# Patient Record
Sex: Female | Born: 1980 | Race: White | Hispanic: No | Marital: Married | Smoking: Never smoker
Health system: Southern US, Community
[De-identification: ages and names within clinical notes are randomized; demographics above are authoritative.]

## PROBLEM LIST (undated history)

## (undated) DIAGNOSIS — Z5189 Encounter for other specified aftercare: Secondary | ICD-10-CM

## (undated) DIAGNOSIS — K219 Gastro-esophageal reflux disease without esophagitis: Secondary | ICD-10-CM

## (undated) DIAGNOSIS — O24419 Gestational diabetes mellitus in pregnancy, unspecified control: Secondary | ICD-10-CM

## (undated) HISTORY — PX: WISDOM TOOTH EXTRACTION: SHX21

## (undated) HISTORY — DX: Gastro-esophageal reflux disease without esophagitis: K21.9

## (undated) HISTORY — DX: Encounter for other specified aftercare: Z51.89

---

## 2014-12-28 ENCOUNTER — Ambulatory Visit (INDEPENDENT_AMBULATORY_CARE_PROVIDER_SITE_OTHER): Payer: BLUE CROSS/BLUE SHIELD | Admitting: Urgent Care

## 2014-12-28 VITALS — BP 102/74 | HR 77 | Temp 98.4°F | Resp 16 | Ht 62.0 in | Wt 149.0 lb

## 2014-12-28 DIAGNOSIS — N63 Unspecified lump in breast: Secondary | ICD-10-CM

## 2014-12-28 DIAGNOSIS — N6310 Unspecified lump in the right breast, unspecified quadrant: Secondary | ICD-10-CM

## 2014-12-28 NOTE — Patient Instructions (Signed)
Fibroadenoma A fibroadenoma is a small, round, rubbery lump (tumor). The lump is not cancer. It often does not cause pain. It may move slightly when you touch it. This kind of lump can grow in one or both breasts. HOME CARE  Check your lump often for any changes.  Keep all follow-up exams and mammograms. GET HELP RIGHT AWAY IF:  The lump changes in size.  The lump becomes tender and painful.  You have fluid coming from your nipple. MAKE SURE YOU:  Understand these instructions.  Will watch your condition.  Will get help right away if you are not doing well or get worse. Document Released: 01/17/2009 Document Revised: 01/13/2012 Document Reviewed: 01/17/2009 [facility] Patient Information 2015 [place]. This information is not intended to replace advice given to you by your health care provider. Make sure you discuss any questions you have with your health care provider.    Breast Cyst A breast cyst is a sac in the breast that is filled with fluid. Breast cysts are common in women. Women can have one or many cysts. When the breasts contain many cysts, it is usually due to a noncancerous (benign) condition called fibrocystic change. These lumps form under the influence of female hormones (estrogen and progesterone). The lumps are most often located in the upper, outer portion of the breast. They are often more swollen, painful, and tender before your period starts. They usually disappear after menopause, unless you are on hormone therapy.  There are several types of cysts:  Macrocyst. This is a cyst that is about 2 in. (5.1 cm) in diameter.   Microcyst. This is a tiny cyst that you cannot feel but can be seen with a mammogram or an ultrasound.   Galactocele. This is a cyst containing milk that may develop if you suddenly stop breastfeeding.   Sebaceous cyst of the skin. This type of cyst is not in the breast tissue itself. Breast cysts do not increase your risk of breast  cancer. However, they must be monitored closely because they can be cancerous.  CAUSES  It is not known exactly what causes a breast cyst to form. Possible causes include:  An overgrowth of milk glands and connective tissue in the breast can block the milk glands, causing them to fill with fluid.   Scar tissue in the breast from previous surgery may block the glands, causing a cyst.  RISK FACTORS Estrogen may influence the development of a breast cyst.  SIGNS AND SYMPTOMS   Feeling a smooth, round, soft lump (like a grape) in the breast that is easily moveable.   Breast discomfort or pain.  Increase in size of the lump before your menstrual period and decrease in its size after your menstrual period.  DIAGNOSIS  A cyst can be felt during a physical exam by your health care provider. A breast X-ray exam (mammogram) and ultrasonography will be done to confirm the diagnosis. Fluid may be removed from the cyst with a needle (fine needle aspiration) to make sure the cyst is not cancerous.  TREATMENT  Treatment may not be necessary. Your health care provider may monitor the cyst to see if it goes away on its own. If treatment is needed, it may include:  Hormone treatment.   Needle aspiration. There is a chance of the cyst coming back after aspiration.   Surgery to remove the whole cyst.  HOME CARE INSTRUCTIONS   Keep all follow-up appointments with your health care provider.  See your  health care provider regularly:  Get a yearly exam by your health care provider.  Have a clinical breast exam by a health care provider every 1-3 years if you are 60-72 years of age. After age 71 years, you should have the exam every year.   Get mammogram tests as directed by your health care provider.   Understand the normal appearance and feel of your breasts and perform breast self-exams.   Only take over-the-counter or prescription medicines as directed by your health care provider.    Wear a supportive bra, especially when exercising.   Avoid caffeine.   Reduce your salt intake, especially before your menstrual period. Too much salt can cause fluid retention, breast swelling, and discomfort.  SEEK MEDICAL CARE IF:   You feel, or think you feel, a lump in your breast.   You notice that both breasts look or feel different than usual.   Your breast is still causing pain after your menstrual period is over.   You need medicine for breast pain and swelling that occurs with your menstrual period.  SEEK IMMEDIATE MEDICAL CARE IF:   You have severe pain, tenderness, redness, or warmth in your breast.   You have nipple discharge or bleeding.   Your breast lump becomes hard and painful.   You find new lumps or bumps that were not there before.   You feel lumps in your armpit (axilla).   You notice dimpling or wrinkling of the breast or nipple.   You have a fever.  MAKE SURE YOU:  Understand these instructions.  Will watch your condition.  Will get help right away if you are not doing well or get worse. Document Released: 10/21/2005 Document Revised: 06/23/2013 Document Reviewed: 05/20/2013 [facility] Patient Information 2015 [place], [place]. This information is not intended to replace advice given to you by your health care provider. Make sure you discuss any questions you have with your health care provider.

## 2014-12-28 NOTE — Progress Notes (Signed)
    MRN: [id number] DOB: 09/12/1981  Subjective:   Cindy Cooper is a 34 y.o. female presenting for chief complaint of Breast Mass  Reports 2 day history of breast mass found while scratching over the area. Mass is non-tender, firm, immobile. Denies skin changes, rashes, erythema, nipple discharge, nipple inversion, edema, fevers, weight loss, decreased appetite. Denies first-degree relative history of breast cancer. Has 3 children, first child born (2006). Was 34 y/o at menarche. Has never had any masses, has never had reason to check. Denies any other aggravating or relieving factors, no other questions or concerns.  Isaiah currently has no medications in their medication list.  She has No Known Allergies.  Cristian  has a past medical history of Blood transfusion without reported diagnosis and GERD (gastroesophageal reflux disease). Also  has past surgical history that includes Cesarean section.  ROS As in subjective.  Objective:   Vitals: BP 102/74 mmHg  Pulse 77  Temp(Src) 98.4 F (36.9 C) (Oral)  Resp 16  Ht 5\' 2"  (1.575 m)  Wt 149 lb (67.586 kg)  BMI 27.25 kg/m2  SpO2 99%  LMP 11/23/2014  Physical Exam  Constitutional: She is well-developed, well-nourished, and in no distress.  Cardiovascular: Normal rate.   Pulmonary/Chest: Effort normal. Right breast exhibits mass and tenderness. Right breast exhibits no inverted nipple, no nipple discharge and no skin change. Left breast exhibits no inverted nipple, no mass, no nipple discharge, no skin change and no tenderness. Breasts are symmetrical.    No axillary lymphadenopathy.  Skin: Skin is warm and dry. No rash noted. No erythema.   Assessment and Plan :   1. Breast mass, right - Low risk factors for malignancy, likely fibroadenoma versus cyst of breast - Sent for US, [facility] mamm[facility] - Return to clinic in 1 week or sooner if symptoms of infection develop as discussed with  patient  Wallis BambergMario Alynah Schone, PA-C [facility] [phone] 12/28/2014 1:56 PM

## 2015-01-02 ENCOUNTER — Other Ambulatory Visit: Payer: Self-pay | Admitting: Urgent Care

## 2015-01-02 DIAGNOSIS — N6310 Unspecified lump in the right breast, unspecified quadrant: Secondary | ICD-10-CM

## 2015-01-04 ENCOUNTER — Ambulatory Visit
Admission: RE | Admit: 2015-01-04 | Discharge: 2015-01-04 | Disposition: A | Discharge status: Home / Self Care | Payer: BLUE CROSS/BLUE SHIELD | Source: Ambulatory Visit | Attending: Urgent Care | Admitting: Urgent Care

## 2015-01-04 ENCOUNTER — Other Ambulatory Visit: Payer: Self-pay | Admitting: Urgent Care

## 2015-01-04 ENCOUNTER — Telehealth: Payer: Self-pay | Admitting: Urgent Care

## 2015-01-04 DIAGNOSIS — N6310 Unspecified lump in the right breast, unspecified quadrant: Secondary | ICD-10-CM

## 2015-01-04 NOTE — Telephone Encounter (Signed)
Spoke with patient about ultrasound results which was ordered to evaluate a potential breast mass in right breast. Ultrasound was completed and patient was reassured. Anticipatory guidance provided.  Wallis BambergMario Ivylynn Hoppes, PA-C [facility] [phone] 01/04/2015  6:16 PM

## 2015-04-12 ENCOUNTER — Ambulatory Visit (INDEPENDENT_AMBULATORY_CARE_PROVIDER_SITE_OTHER): Payer: BLUE CROSS/BLUE SHIELD | Admitting: Certified Nurse Midwife

## 2015-04-12 ENCOUNTER — Encounter: Payer: Self-pay | Admitting: Certified Nurse Midwife

## 2015-04-12 VITALS — BP 117/77 | HR 74 | Temp 98.7°F | Wt 151.0 lb

## 2015-04-12 DIAGNOSIS — Z3481 Encounter for supervision of other normal pregnancy, first trimester: Secondary | ICD-10-CM

## 2015-04-12 DIAGNOSIS — Z3687 Encounter for antenatal screening for uncertain dates: Secondary | ICD-10-CM

## 2015-04-12 DIAGNOSIS — Z3482 Encounter for supervision of other normal pregnancy, second trimester: Secondary | ICD-10-CM | POA: Diagnosis not present

## 2015-04-12 DIAGNOSIS — B079 Viral wart, unspecified: Secondary | ICD-10-CM

## 2015-04-12 DIAGNOSIS — Z36 Encounter for antenatal screening of mother: Secondary | ICD-10-CM

## 2015-04-12 DIAGNOSIS — O4690 Antepartum hemorrhage, unspecified, unspecified trimester: Secondary | ICD-10-CM

## 2015-04-12 LAB — POCT URINALYSIS DIPSTICK
BILIRUBIN UA: NEGATIVE
Blood, UA: NEGATIVE
Glucose, UA: NEGATIVE
Ketones, UA: NEGATIVE
LEUKOCYTES UA: NEGATIVE
Nitrite, UA: NEGATIVE
PH UA: 7
Protein, UA: NEGATIVE
Urobilinogen, UA: NEGATIVE

## 2015-04-12 MED ORDER — CITRANATAL ASSURE 35-1 & 300 MG PO MISC: 1.0000 | Freq: Every day | ORAL | Status: AC

## 2015-04-12 MED ORDER — DOXYLAMINE-PYRIDOXINE 10-10 MG PO TBEC: DELAYED_RELEASE_TABLET | ORAL | Status: DC

## 2015-04-12 NOTE — Progress Notes (Signed)
Subjective:    Cindy Cooper is being seen today for her first obstetrical visit.  This is not a planned pregnancy but was not preventing pregnancy. She is at 3728w2d gestation. Her obstetrical history is significant for GDM, hx of postpartum hemorrhage after c-section, has had 3 c-sections, had thrombocytopenia with last pregnancy. Relationship with FOB: spouse, living together. Patient does not intend to breast feed. Pregnancy history fully reviewed.  Has a hx of irregular cycles.  Reports vaginal bleeding with sexual intercourse and has had small amount of brown spotting irregularlly.    The information documented in the HPI was reviewed and verified.  Menstrual History: OB History    Gravida Para Term Preterm AB TAB SAB Ectopic Multiple Living   4 3 3       3       Menarche age: 9311-11 yrs of age.    Patient's last menstrual period was 02/13/2015 (approximate).  Unsure of LMP.      Past Medical History  Diagnosis Date  . Blood transfusion without reported diagnosis   . GERD (gastroesophageal reflux disease)     Past Surgical History  Procedure Laterality Date  . Cesarean section    . Wisdom tooth extraction       (Not in a hospital admission) No Known Allergies  History  Substance Use Topics  . Smoking status: Never Smoker   . Smokeless tobacco: Never Used  . Alcohol Use: No    Family History  Problem Relation Age of Onset  . Diabetes Mother   . Diabetes Father   . Hyperlipidemia Father   . Hypertension Father      Review of Systems Constitutional: negative for weight loss Gastrointestinal: negative for vomiting, + nausea Genitourinary:negative for genital lesions and vaginal discharge and dysuria Musculoskeletal:negative for back pain Behavioral/Psych: negative for abusive relationship, depression, illegal drug usage and tobacco use    Objective:    BP 117/77 mmHg  Pulse 74  Temp(Src) 98.7 F (37.1 C)  Wt 151 lb (68.493 kg)  LMP 02/13/2015 (Approximate) General  Appearance:    Alert, cooperative, no distress, appears stated age  Head:    Normocephalic, without obvious abnormality, atraumatic  Eyes:    PERRL, conjunctiva/corneas clear, EOM's intact, fundi    benign, both eyes  Ears:    Normal TM's and external ear canals, both ears  Nose:   Nares normal, septum midline, mucosa normal, no drainage    or sinus tenderness  Throat:   Lips, mucosa, and tongue normal; teeth and gums normal  Neck:   Supple, symmetrical, trachea midline, no adenopathy;    thyroid:  no enlargement/tenderness/nodules; no carotid   bruit or JVD  Back:     Symmetric, no curvature, ROM normal, no CVA tenderness  Lungs:     Clear to auscultation bilaterally, respirations unlabored  Chest Wall:    No tenderness or deformity   Heart:    Regular rate and rhythm, S1 and S2 normal, no murmur, rub   or gallop  Breast Exam:    No tenderness, masses, or nipple abnormality  Abdomen:     Soft, non-tender, bowel sounds active all four quadrants,    no masses, no organomegaly  Genitalia:    Normal female without lesion, discharge or tenderness  Extremities:   Extremities normal, atraumatic, no cyanosis or edema  Pulses:   2+ and symmetric all extremities  Skin:   Skin color, texture, turgor normal, no rashes. + wart on right knee, has been their for  about 4 months.   Lymph nodes:   Cervical, supraclavicular, and axillary nodes normal  Neurologic:   CNII-XII intact, normal strength, sensation and reflexes    throughout      Lab Review Urine pregnancy test Labs reviewed yes Radiologic studies reviewed no Assessment:    Pregnancy at [redacted]w[redacted]d weeks    Plan:      Prenatal vitamins.  Counseling provided regarding continued use of seat belts, cessation of alcohol consumption, smoking or use of illicit drugs; infection precautions i.e., influenza/TDAP immunizations, toxoplasmosis,CMV, parvovirus, listeria and varicella; workplace safety, exercise during pregnancy; routine dental care, safe  medications, sexual activity, hot tubs, saunas, pools, travel, caffeine use, fish and methlymercury, potential toxins, hair treatments, varicose veins Weight gain recommendations per IOM guidelines reviewed: underweight/BMI< 18.5--> gain 28 - 40 lbs; normal weight/BMI 18.5 - 24.9--> gain 25 - 35 lbs; overweight/BMI 25 - 29.9--> gain 15 - 25 lbs; obese/BMI >30->gain  11 - 20 lbs Problem list reviewed and updated. FIRST/CF mutation testing/NIPT/QUAD SCREEN/fragile X/Ashkenazi Jewish population testing/Spinal muscular atrophy discussed: declined. Role of ultrasound in pregnancy discussed; fetal survey: requested. Amniocentesis discussed: not indicated.  Meds ordered this encounter  Medications  . Prenatal Vit-Fe Fumarate-FA (PRENATAL VITAMINS PLUS PO)    Sig: Take by mouth.  . Prenat w/o A-FeCbGl-DSS-FA-DHA (CITRANATAL ASSURE) 35-1 & 300 MG tablet    Sig: Take 1 tablet by mouth daily.    Dispense:  60 tablet    Refill:  12  . Doxylamine-Pyridoxine (DICLEGIS) 10-10 MG TBEC    Sig: Take 1 tab at breakfast, 1 tablet at lunch, and 2 tablets at bedtime.    Dispense:  100 tablet    Refill:  4   Orders Placed This Encounter  Procedures  . Culture, OB Urine  . SureSwab, Vaginosis/Vaginitis Plus  . US OB Comp Less 14 Wks    Standing Status: Future     Number of Occurrences:      Standing Expiration Date: 06/11/2016    Order Specific Question:  Reason for Exam (SYMPTOM  OR DIAGNOSIS REQUIRED)    Answer:  unsure of LMP and has had vaginal bleeding after intercourse    Order Specific Question:  Preferred imaging location?    Answer:  Internal  . Obstetric panel  . HIV antibody  . Hemoglobinopathy evaluation  . Varicella zoster antibody, IgG  . Vit D  25 hydroxy (rtn osteoporosis monitoring)  . Ambulatory referral to Dermatology    Referral Priority:  Routine    Referral Type:  Consultation    Referral Reason:  Specialty Services Required    Requested Specialty:  Dermatology    Number of  Visits Requested:  1  . POCT urinalysis dipstick    Follow up in 4 weeks. 50% of 30 min visit spent on counseling and coordination of care.

## 2015-04-13 LAB — OBSTETRIC PANEL
Antibody Screen: NEGATIVE
BASOS ABS: 0 10*3/uL (ref 0.0–0.1)
Basophils Relative: 0 % (ref 0–1)
EOS PCT: 4 % (ref 0–5)
Eosinophils Absolute: 0.4 10*3/uL (ref 0.0–0.7)
HCT: 40.3 % (ref 36.0–46.0)
HEMOGLOBIN: 13.6 g/dL (ref 12.0–15.0)
Hepatitis B Surface Ag: NEGATIVE
Lymphocytes Relative: 25 % (ref 12–46)
Lymphs Abs: 2.6 10*3/uL (ref 0.7–4.0)
MCH: 27.6 pg (ref 26.0–34.0)
MCHC: 33.7 g/dL (ref 30.0–36.0)
MCV: 81.7 fL (ref 78.0–100.0)
MONO ABS: 0.6 10*3/uL (ref 0.1–1.0)
MPV: 10.7 fL (ref 8.6–12.4)
Monocytes Relative: 6 % (ref 3–12)
Neutro Abs: 6.8 10*3/uL (ref 1.7–7.7)
Neutrophils Relative %: 65 % (ref 43–77)
Platelets: 156 10*3/uL (ref 150–400)
RBC: 4.93 MIL/uL (ref 3.87–5.11)
RDW: 13.8 % (ref 11.5–15.5)
Rh Type: NEGATIVE
Rubella: 1.01 Index — ABNORMAL HIGH (ref <0.90)
WBC: 10.4 10*3/uL (ref 4.0–10.5)

## 2015-04-13 LAB — VITAMIN D 25 HYDROXY (VIT D DEFICIENCY, FRACTURES): Vit D, 25-Hydroxy: 22 ng/mL — ABNORMAL LOW (ref 30–100)

## 2015-04-13 LAB — VARICELLA ZOSTER ANTIBODY, IGG: Varicella IgG: 277.1 Index — ABNORMAL HIGH (ref <135.00)

## 2015-04-13 LAB — CULTURE, OB URINE
Colony Count: NO GROWTH
Organism ID, Bacteria: NO GROWTH

## 2015-04-13 LAB — HIV ANTIBODY (ROUTINE TESTING W REFLEX): HIV: NONREACTIVE

## 2015-04-14 LAB — HEMOGLOBINOPATHY EVALUATION
HEMOGLOBIN OTHER: 0 %
HGB F QUANT: 0 % (ref 0.0–2.0)
HGB S QUANTITAION: 0 %
Hgb A2 Quant: 2.6 % (ref 2.2–3.2)
Hgb A: 97.4 % (ref 96.8–97.8)

## 2015-04-14 LAB — PAP IG AND HPV HIGH-RISK: HPV DNA HIGH RISK: NOT DETECTED

## 2015-04-15 LAB — SURESWAB, VAGINOSIS/VAGINITIS PLUS
Atopobium vaginae: NOT DETECTED Log (cells/mL)
C. GLABRATA, DNA: NOT DETECTED
C. PARAPSILOSIS, DNA: NOT DETECTED
C. albicans, DNA: NOT DETECTED
C. trachomatis RNA, TMA: NOT DETECTED
C. tropicalis, DNA: NOT DETECTED
LACTOBACILLUS SPECIES: 6.5 Log (cells/mL)
MEGASPHAERA SPECIES: NOT DETECTED Log (cells/mL)
N. GONORRHOEAE RNA, TMA: NOT DETECTED
T. VAGINALIS RNA, QL TMA: NOT DETECTED

## 2015-04-17 ENCOUNTER — Telehealth: Payer: Self-pay

## 2015-04-17 NOTE — Telephone Encounter (Signed)
PATIENT IS SCHEDULED WITH DR Joanna Puff FOR DERMATOLOGY APPT ON 05/16/15 AT 9:15AM

## 2015-04-28 ENCOUNTER — Ambulatory Visit (INDEPENDENT_AMBULATORY_CARE_PROVIDER_SITE_OTHER): Payer: BLUE CROSS/BLUE SHIELD

## 2015-04-28 DIAGNOSIS — Z3687 Encounter for antenatal screening for uncertain dates: Secondary | ICD-10-CM

## 2015-04-28 DIAGNOSIS — Z36 Encounter for antenatal screening of mother: Secondary | ICD-10-CM | POA: Diagnosis not present

## 2015-04-28 DIAGNOSIS — O4690 Antepartum hemorrhage, unspecified, unspecified trimester: Secondary | ICD-10-CM | POA: Diagnosis not present

## 2015-04-28 LAB — US OB COMP LESS 14 WKS

## 2015-05-10 ENCOUNTER — Encounter: Payer: BLUE CROSS/BLUE SHIELD | Admitting: Certified Nurse Midwife

## 2015-05-10 ENCOUNTER — Other Ambulatory Visit: Payer: Self-pay | Admitting: Certified Nurse Midwife

## 2015-05-12 ENCOUNTER — Encounter: Payer: Self-pay | Admitting: Certified Nurse Midwife

## 2015-05-12 ENCOUNTER — Ambulatory Visit (INDEPENDENT_AMBULATORY_CARE_PROVIDER_SITE_OTHER): Payer: BLUE CROSS/BLUE SHIELD | Admitting: Certified Nurse Midwife

## 2015-05-12 VITALS — BP 120/74 | HR 73 | Temp 100.1°F | Wt 152.4 lb

## 2015-05-12 DIAGNOSIS — Z3492 Encounter for supervision of normal pregnancy, unspecified, second trimester: Secondary | ICD-10-CM

## 2015-05-12 NOTE — Progress Notes (Signed)
  Subjective:    Cindy Cooper is a 34 y.o. female being seen today for her obstetrical visit. She is at 4045w3d gestation. Patient reports: nausea, no bleeding, no contractions, no cramping, no leaking and vomiting and round ligament pain of sharp stabbing pain sensation with sudden movement.  RX for abdominal support belt given.   Problem List Items Addressed This Visit    None    Visit Diagnoses    Prenatal care, second trimester    -  Primary    Relevant Orders    POCT urinalysis dipstick      There are no active problems to display for this patient.   Objective:     BP 120/74 mmHg  Pulse 73  Temp(Src) 100.1 F (37.8 C)  Wt 152 lb 6.4 oz (69.128 kg)  LMP 02/13/2015 (Approximate) Uterine Size: Below umbilicus   FHR activity seen on bedside US.   Assessment:    Pregnancy @ 8445w3d  weeks Doing well    Round ligament pain  Plan:    Problem list reviewed and updated. Labs reviewed. Rx: abdominal maternity support belt Follow up in 4 weeks. FIRST/CF mutation testing/NIPT/QUAD SCREEN/fragile X/Ashkenazi Jewish population testing/Spinal muscular atrophy discussed: requested. Role of ultrasound in pregnancy discussed; fetal survey: requested. Amniocentesis discussed: not indicated. 50% of 15 minute visit spent on counseling and coordination of care.

## 2015-06-09 ENCOUNTER — Ambulatory Visit (INDEPENDENT_AMBULATORY_CARE_PROVIDER_SITE_OTHER): Payer: BLUE CROSS/BLUE SHIELD | Admitting: Certified Nurse Midwife

## 2015-06-09 VITALS — BP 119/72 | HR 100 | Temp 98.9°F | Wt 154.8 lb

## 2015-06-09 DIAGNOSIS — Z3482 Encounter for supervision of other normal pregnancy, second trimester: Secondary | ICD-10-CM | POA: Diagnosis not present

## 2015-06-09 LAB — POCT URINALYSIS DIPSTICK
BILIRUBIN UA: NEGATIVE
Blood, UA: NEGATIVE
Glucose, UA: NORMAL
Ketones, UA: NEGATIVE
Leukocytes, UA: NEGATIVE
NITRITE UA: NEGATIVE
Protein, UA: NEGATIVE
SPEC GRAV UA: 1.02
Urobilinogen, UA: NEGATIVE
pH, UA: 5

## 2015-06-09 NOTE — Progress Notes (Signed)
  Subjective:    Cindy Cooper is a 34 y.o. female being seen today for her obstetrical visit. She is at [redacted]w[redacted]d gestation. Patient reports: no complaints.  Problem List Items Addressed This Visit    None    Visit Diagnoses    Supervision of other normal pregnancy, antepartum, second trimester    -  Primary    Relevant Orders    AFP, Quad Screen    US OB Comp + 14 Wk    POCT urinalysis dipstick (Completed)      There are no active problems to display for this patient.   Objective:     BP 119/72 mmHg  Pulse 100  Temp(Src) 98.9 F (37.2 C)  Wt 154 lb 12.8 oz (70.217 kg)  LMP 02/13/2015 (Approximate) Uterine Size: Below umbilicus   FHR: 150's by doppler  Assessment:    Pregnancy @ [redacted]w[redacted]d  weeks Doing well    Plan:    Problem list reviewed and updated. Labs reviewed. Follow up in 4 weeks. FIRST/CF mutation testing/NIPT/QUAD SCREEN/fragile X/Ashkenazi Jewish population testing/Spinal muscular atrophy discussed: ordered. Role of ultrasound in pregnancy discussed; fetal survey: ordered. Amniocentesis discussed: not indicated. 50% of 15 minute visit spent on counseling and coordination of care.

## 2015-06-14 LAB — AFP, QUAD SCREEN
AFP: 29.3 ng/mL
CURR GEST AGE: 15.3 wks.days
HCG TOTAL: 38.82 [IU]/mL
INH: 222.1 pg/mL
Interpretation-AFP: NEGATIVE
MOM FOR INH: 1.27
MoM for AFP: 0.96
MoM for hCG: 0.87
Open Spina bifida: NEGATIVE
Osb Risk: 1:15400 {titer}
Tri 18 Scr Risk Est: NEGATIVE
Trisomy 18 (Edward) Syndrome Interp.: 1:21900 {titer}
uE3 Mom: 1.18
uE3 Value: 0.79 ng/mL

## 2015-07-07 ENCOUNTER — Other Ambulatory Visit: Payer: Self-pay | Admitting: Certified Nurse Midwife

## 2015-07-07 ENCOUNTER — Ambulatory Visit (HOSPITAL_COMMUNITY)
Admission: RE | Admit: 2015-07-07 | Discharge: 2015-07-07 | Disposition: A | Discharge status: Home / Self Care | Payer: BLUE CROSS/BLUE SHIELD | Source: Ambulatory Visit | Attending: Certified Nurse Midwife | Admitting: Certified Nurse Midwife

## 2015-07-07 ENCOUNTER — Ambulatory Visit (INDEPENDENT_AMBULATORY_CARE_PROVIDER_SITE_OTHER): Payer: BLUE CROSS/BLUE SHIELD | Admitting: Certified Nurse Midwife

## 2015-07-07 VITALS — BP 121/75 | HR 89 | Temp 98.5°F | Wt 161.2 lb

## 2015-07-07 DIAGNOSIS — Z3482 Encounter for supervision of other normal pregnancy, second trimester: Secondary | ICD-10-CM | POA: Diagnosis not present

## 2015-07-07 DIAGNOSIS — O09522 Supervision of elderly multigravida, second trimester: Secondary | ICD-10-CM | POA: Insufficient documentation

## 2015-07-07 DIAGNOSIS — Z3481 Encounter for supervision of other normal pregnancy, first trimester: Secondary | ICD-10-CM

## 2015-07-07 DIAGNOSIS — O34219 Maternal care for unspecified type scar from previous cesarean delivery: Secondary | ICD-10-CM

## 2015-07-07 DIAGNOSIS — K219 Gastro-esophageal reflux disease without esophagitis: Secondary | ICD-10-CM

## 2015-07-07 DIAGNOSIS — O3421 Maternal care for scar from previous cesarean delivery: Secondary | ICD-10-CM | POA: Diagnosis present

## 2015-07-07 DIAGNOSIS — Z3A19 19 weeks gestation of pregnancy: Secondary | ICD-10-CM

## 2015-07-07 LAB — POCT URINALYSIS DIPSTICK
Bilirubin, UA: NEGATIVE
Glucose, UA: NORMAL
Ketones, UA: NEGATIVE
Leukocytes, UA: NEGATIVE
Nitrite, UA: NEGATIVE
PH UA: 5
PROTEIN UA: NEGATIVE
RBC UA: NEGATIVE
SPEC GRAV UA: 1.02
UROBILINOGEN UA: NEGATIVE

## 2015-07-07 IMAGING — US US MFM OB DETAIL+14 WK
1 series · 14 of 28 positions shown · non-contrast
Comparison: none

[Series 1: us mfm ob detail+14 wk · 70 acquisitions, 14 frames shown]
[im 3/70]
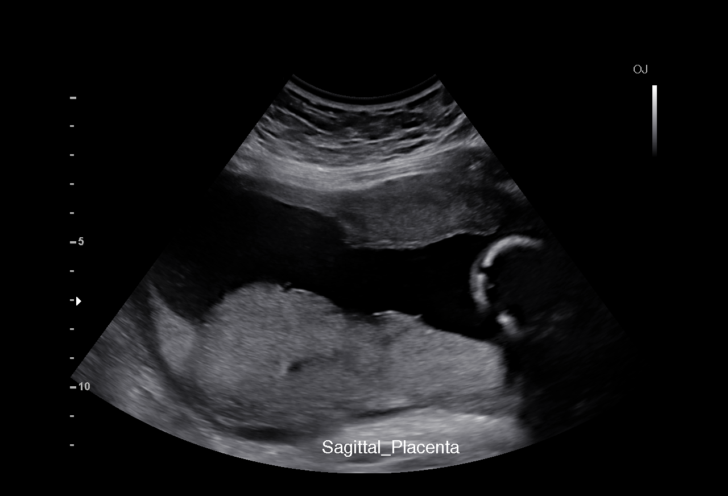
[im 8/70]
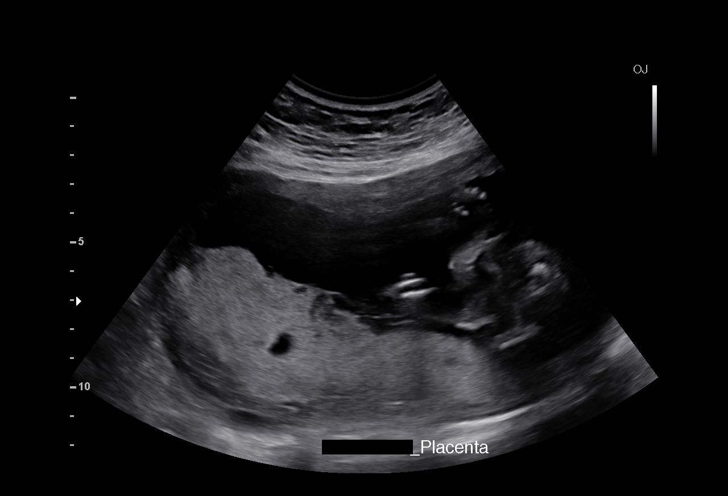
[im 13/70]
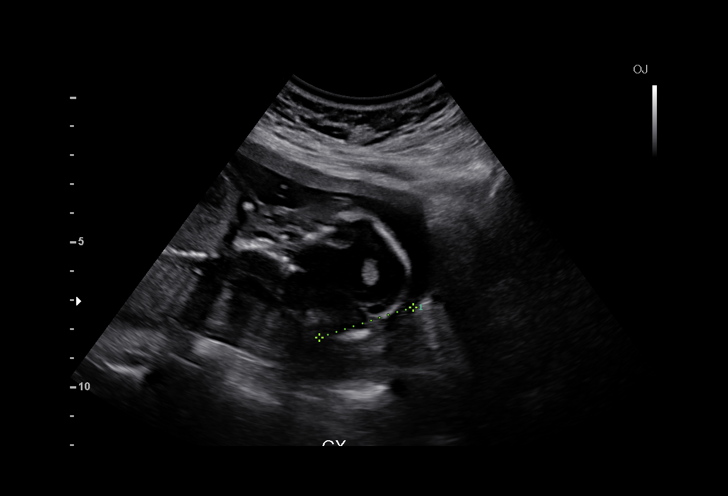
[im 18/70]
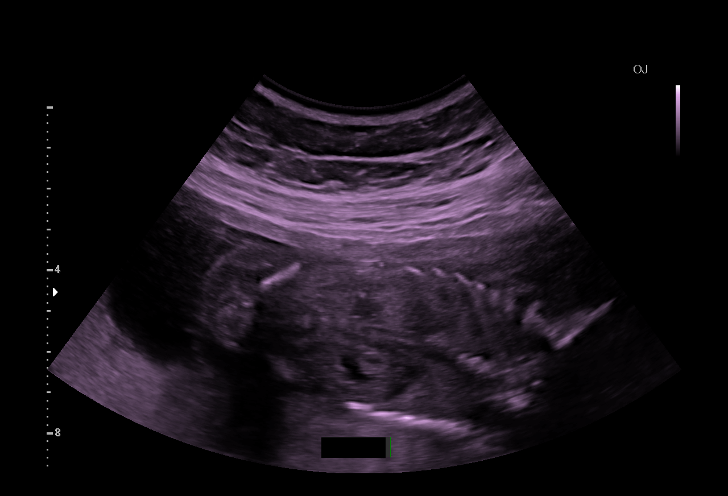
[im 24/70]
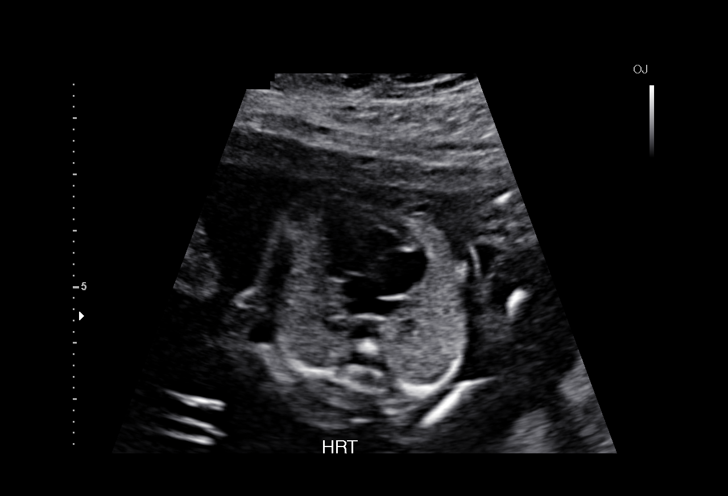
[im 29/70]
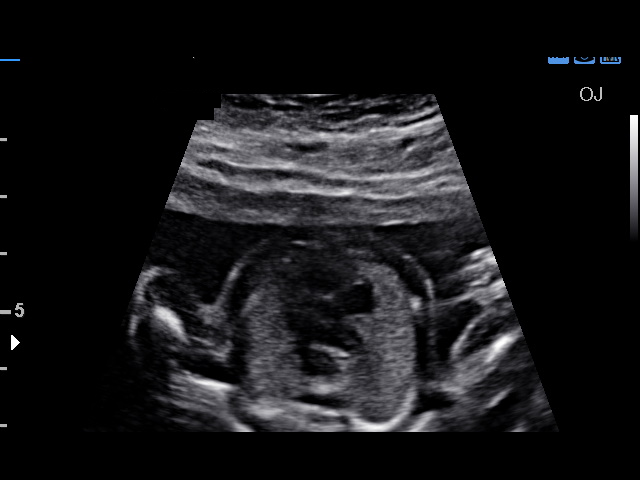
[im 34/70]
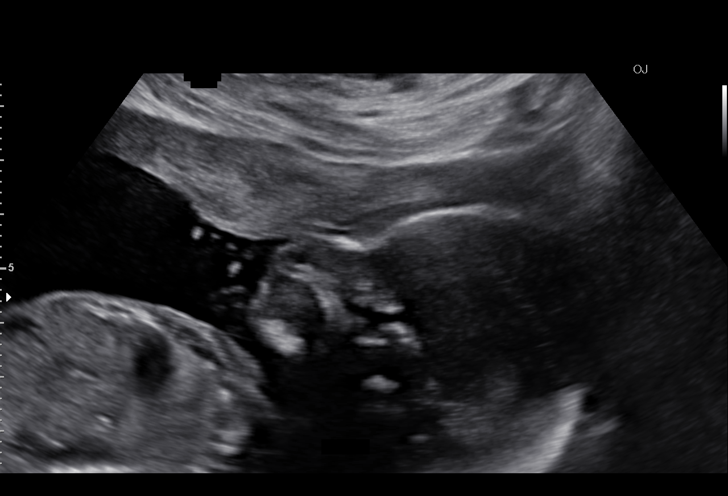
[im 39/70]
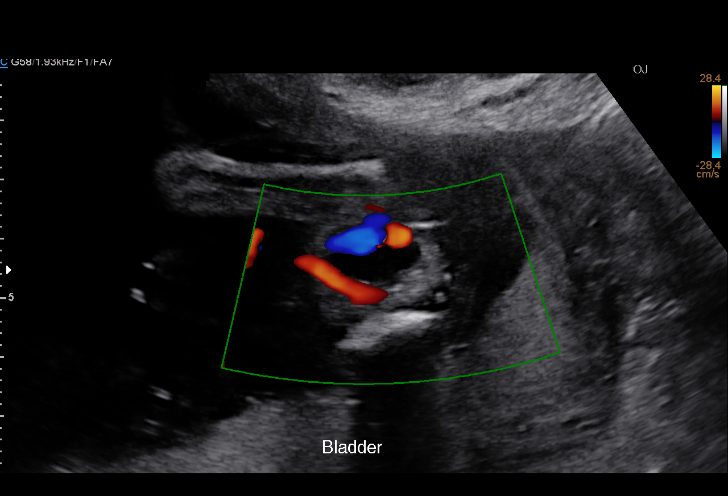
[im 44/70]
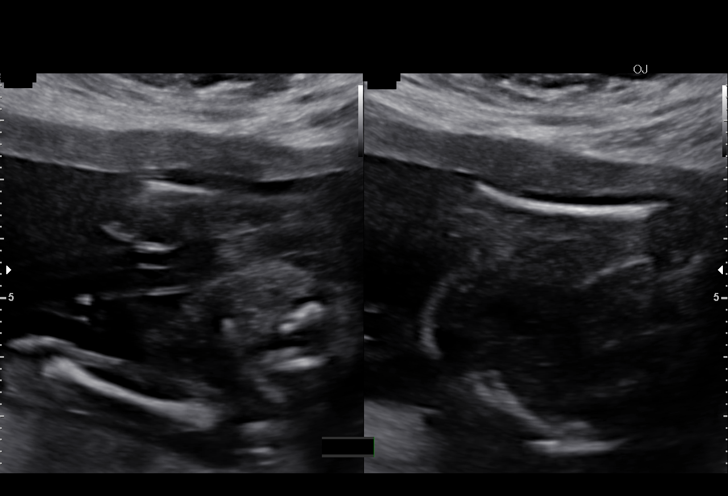
[im 49/70]
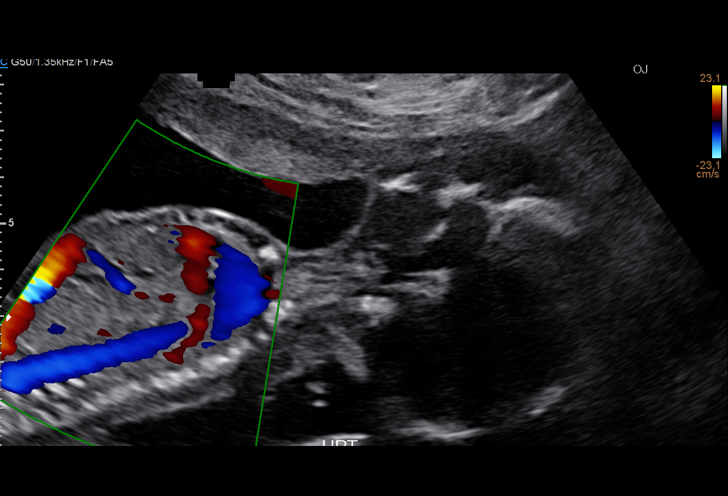
[im 54/70]
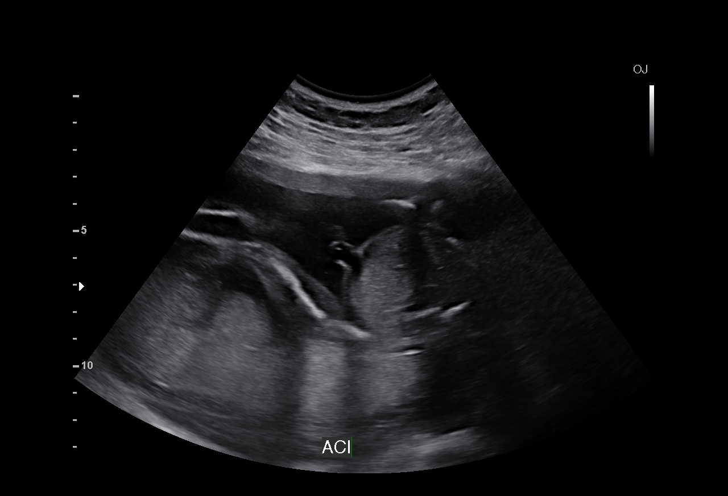
[im 59/70]
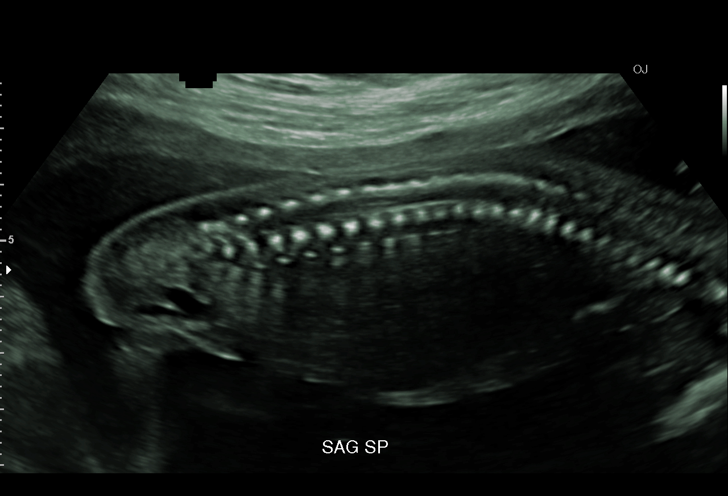
[im 64/70]
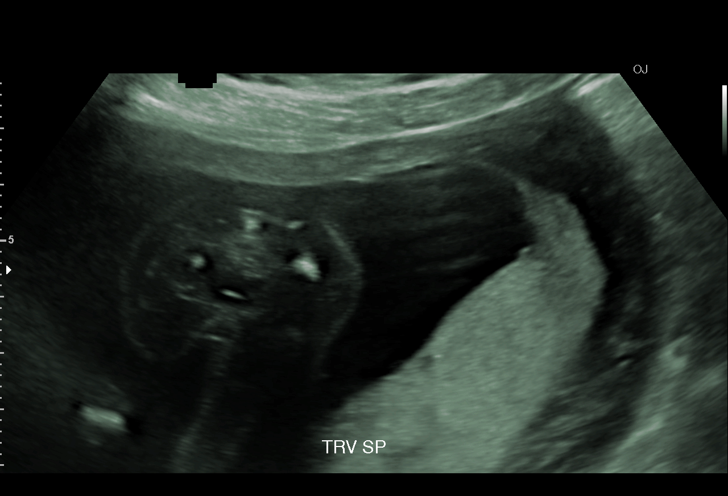
[im 70/70]
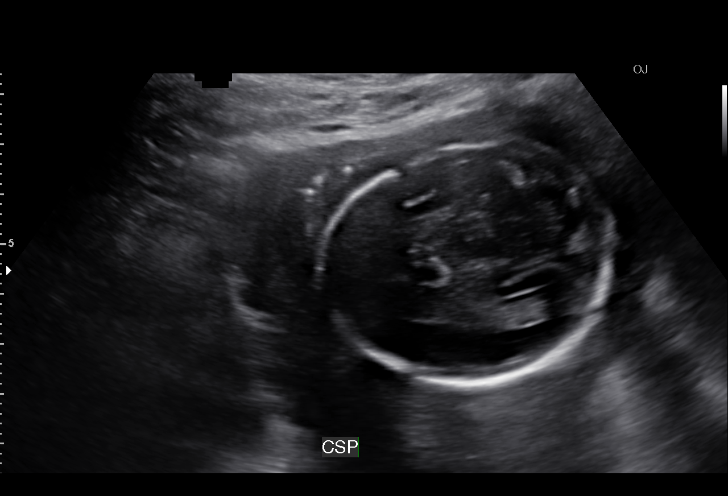

[14 of 28 positions shown; findings below may reference images not displayed]

OBSTETRICS REPORT

Service(s) Provided

Indications

History of cesarean delivery, currently pregnant      [J0]
Advanced maternal age multigravida 35+, second        [J0]
trimester
Detailed fetal anatomic survey                        Z36
19 weeks gestation of pregnancy
Fetal Evaluation

Num Of Fetuses:    1
Fetal Heart Rate:  160                          bpm
Cardiac Activity:  Observed
Presentation:      Cephalic
Placenta:          Posterior, above cervical
os
P. Cord            Previously Visualized
Insertion:

Amniotic Fluid
AFI FV:      Subjectively within normal limits
Larg Pckt:     5.1  cm
Biometry

BPD:     46.9  mm     G. Age:  20w 1d                CI:        75.55   70 - 86
FL/HC:      18.6   16.1 -
18.3
HC:     171.1  mm     G. Age:  19w 5d       56  %    HC/AC:      1.19   1.09 -
1.39
AC:     143.6  mm     G. Age:  19w 5d       54  %    FL/BPD:
FL:      31.8  mm     G. Age:  19w 6d       59  %    FL/AC:      22.1   20 - 24
HUM:     31.5  mm     G. Age:  20w 4d       80  %

Est. FW:     313  gm    0 lb 11 oz      52  %
Gestational Age

LMP:           20w 4d        Date:  [DATE]                 EDD:   [DATE]
U/S Today:     19w 6d                                        EDD:   [DATE]
Best:          19w 3d     Det. By:  Early Ultrasound         EDD:   [DATE]
([DATE])
Anatomy

Cranium:          Appears normal         Aortic Arch:      Appears normal
Fetal Cavum:      Appears normal         Ductal Arch:      Appears normal
Ventricles:       Appears normal         Diaphragm:        Appears normal
Choroid Plexus:   Appears normal         Stomach:          Appears normal, left
sided
Cerebellum:       Appears normal         Abdomen:          Appears normal
Posterior Fossa:  Appears normal         Abdominal Wall:   Appears nml (cord
insert, abd wall)
Nuchal Fold:      Appears normal         Cord Vessels:     Appears normal (3
vessel cord)
Face:             Appears normal         Kidneys:          Appear normal
(orbits and profile)
Lips:             Appears normal         Bladder:          Appears normal
Heart:            Appears normal         Spine:            Appears normal
(4CH, axis, and
situs)
RVOT:             Appears normal         Lower             Appears normal
Extremities:
LVOT:             Appears normal         Upper             Appears normal
Extremities:

Other:  Female gender.
Cervix Uterus Adnexa

Cervical Length:    3.6      cm

Cervix:       Normal appearance by transabdominal scan.

Left Ovary:    Not visualized. No adnexal mass visualized.
Right Ovary:   Not visualized. No adnexal mass visualized.
Impression

Singleton intrauterine pregnancy at 19 weeks 3 days
gestation with fetal cardiac activity
Cephalic presentation
Posterior placenta without evidence of previa
Normal appearing fetal growth and amniotic fluid volume
No apparent birth defects noted on fetal anatomic survey
Normal appearing cervical length
Recommendations

Follow-up ultrasounds as clinically indicated.

questions or concerns.

## 2015-07-07 MED ORDER — LANSOPRAZOLE 30 MG PO CPDR: 30.0000 mg | DELAYED_RELEASE_CAPSULE | Freq: Every day | ORAL | Status: AC

## 2015-07-07 NOTE — Progress Notes (Signed)
Subjective:    Cindy Cooper is a 34 y.o. female being seen today for her obstetrical visit. She is at [redacted]w[redacted]d gestation. Patient reports: heartburn, no bleeding, no contractions, no cramping and no leaking . States that Tums are not working, is eating small frequent bland meals.  Fetal movement: normal.  Problem List Items Addressed This Visit    None    Visit Diagnoses    Gastroesophageal reflux disease, esophagitis presence not specified    -  Primary    Relevant Medications    lansoprazole (PREVACID) 30 MG capsule      There are no active problems to display for this patient.  Objective:    BP 121/75 mmHg  Pulse 89  Temp(Src) 98.5 F (36.9 C)  Wt 161 lb 3.2 oz (73.12 kg)  LMP 02/13/2015 (Approximate) FHT: 145 BPM  Uterine Size: size equals dates     Assessment:    Pregnancy @ [redacted]w[redacted]d    GERD Doing well Plan:    OBGCT: discussed. Signs and symptoms of preterm labor: discussed.  Labs, problem list reviewed and updated 2 hr GTT planned Follow up in 4 weeks.

## 2015-07-07 NOTE — Addendum Note (Signed)
Addended by: Clearnce Hasten on: 07/07/2015 04:30 PM   Modules accepted: Orders

## 2015-07-13 ENCOUNTER — Other Ambulatory Visit: Payer: Self-pay | Admitting: Certified Nurse Midwife

## 2015-08-04 ENCOUNTER — Encounter: Payer: BLUE CROSS/BLUE SHIELD | Admitting: Certified Nurse Midwife

## 2015-08-11 ENCOUNTER — Ambulatory Visit (INDEPENDENT_AMBULATORY_CARE_PROVIDER_SITE_OTHER): Payer: BLUE CROSS/BLUE SHIELD | Admitting: Certified Nurse Midwife

## 2015-08-11 VITALS — BP 117/72 | HR 86 | Temp 98.1°F | Wt 167.0 lb

## 2015-08-11 DIAGNOSIS — Z3482 Encounter for supervision of other normal pregnancy, second trimester: Secondary | ICD-10-CM

## 2015-08-11 DIAGNOSIS — O09523 Supervision of elderly multigravida, third trimester: Secondary | ICD-10-CM | POA: Diagnosis not present

## 2015-08-11 DIAGNOSIS — G44201 Tension-type headache, unspecified, intractable: Secondary | ICD-10-CM

## 2015-08-11 LAB — POCT URINALYSIS DIPSTICK
BILIRUBIN UA: NEGATIVE
Blood, UA: NEGATIVE
GLUCOSE UA: NEGATIVE
KETONES UA: NEGATIVE
LEUKOCYTES UA: NEGATIVE
NITRITE UA: NEGATIVE
Protein, UA: NEGATIVE
Spec Grav, UA: 1.02
Urobilinogen, UA: NEGATIVE
pH, UA: 5

## 2015-08-11 MED ORDER — BUTALBITAL-APAP-CAFFEINE 50-325-40 MG PO TABS: 1.0000 | ORAL_TABLET | Freq: Four times a day (QID) | ORAL | Status: AC | PRN

## 2015-08-11 NOTE — Progress Notes (Signed)
Subjective:    Cindy Cooper is a 34 y.o. female being seen today for her obstetrical visit. She is at [redacted]w[redacted]d gestation. Patient reports: no bleeding, no contractions, no leaking and cramping since yesturday.  States that she has been drinking water.  Denies any pelvic pressure . Fetal movement: normal.  Problem List Items Addressed This Visit    None    Visit Diagnoses    Supervision of other normal pregnancy, antepartum, second trimester    -  Primary    Relevant Orders    POCT urinalysis dipstick (Completed)    Intractable tension-type headache, unspecified chronicity pattern        Relevant Medications    butalbital-acetaminophen-caffeine (FIORICET) 50-325-40 MG tablet      There are no active problems to display for this patient.  Objective:    BP 117/72 mmHg  Pulse 86  Temp(Src) 98.1 F (36.7 C)  Wt 167 lb (75.751 kg)  LMP 02/13/2015 (Approximate) FHT: 135 BPM  Uterine Size: size equals dates   Toco:  No contractions for 20 minutes, some irritability noted with fetal movement.   Assessment:    Pregnancy @ [redacted]w[redacted]d    Uterine irritability most likely d/t fetal movement  RH- status, Rhogam at next ROB  Plan:    OBGCT: discussed. Signs and symptoms of preterm labor: discussed.  Labs, problem list reviewed and updated 2 hr GTT planned for next ROB visit.  Follow up in 4 weeks.

## 2015-09-08 ENCOUNTER — Other Ambulatory Visit: Payer: BLUE CROSS/BLUE SHIELD

## 2015-09-08 ENCOUNTER — Ambulatory Visit (INDEPENDENT_AMBULATORY_CARE_PROVIDER_SITE_OTHER): Payer: BLUE CROSS/BLUE SHIELD | Admitting: Certified Nurse Midwife

## 2015-09-08 VITALS — BP 111/72 | HR 93 | Temp 98.0°F | Wt 170.0 lb

## 2015-09-08 DIAGNOSIS — O360130 Maternal care for anti-D [Rh] antibodies, third trimester, not applicable or unspecified: Secondary | ICD-10-CM

## 2015-09-08 DIAGNOSIS — Z3493 Encounter for supervision of normal pregnancy, unspecified, third trimester: Secondary | ICD-10-CM

## 2015-09-08 LAB — POCT URINALYSIS DIPSTICK
Bilirubin, UA: NEGATIVE
Blood, UA: NEGATIVE
Glucose, UA: NEGATIVE
KETONES UA: NEGATIVE
LEUKOCYTES UA: NEGATIVE
Nitrite, UA: NEGATIVE
PH UA: 8.5
Spec Grav, UA: <=1.005
Urobilinogen, UA: NEGATIVE

## 2015-09-08 LAB — CBC
HCT: 33.7 % — ABNORMAL LOW (ref 36.0–46.0)
Hemoglobin: 11.4 g/dL — ABNORMAL LOW (ref 12.0–15.0)
MCH: 29.2 pg (ref 26.0–34.0)
MCHC: 33.8 g/dL (ref 30.0–36.0)
MCV: 86.2 fL (ref 78.0–100.0)
MPV: 9.9 fL (ref 8.6–12.4)
PLATELETS: 122 10*3/uL — AB (ref 150–400)
RBC: 3.91 MIL/uL (ref 3.87–5.11)
RDW: 13.6 % (ref 11.5–15.5)
WBC: 9.3 10*3/uL (ref 4.0–10.5)

## 2015-09-08 MED ORDER — RHO D IMMUNE GLOBULIN 1500 UNIT/2ML IJ SOSY
300.0000 ug | PREFILLED_SYRINGE | Freq: Once | INTRAMUSCULAR | Status: AC
  Administered 2015-09-08: 300 ug via INTRAMUSCULAR

## 2015-09-08 NOTE — Progress Notes (Signed)
Subjective:    Cindy Cooper is a 34 y.o. female being seen today for her obstetrical visit. She is at 9250w3d gestation. Patient reports no complaints. Fetal movement: normal.  Problem List Items Addressed This Visit    None    Visit Diagnoses    Prenatal care, third trimester    -  Primary    Relevant Orders    POCT urinalysis dipstick (Completed)    Glucose Tolerance, 2 Hours w/1 Hour    CBC    HIV antibody    RPR    Rh negative state in antepartum period, third trimester, not applicable or unspecified fetus        Relevant Medications    rho (d) immune globulin (RHIG/RHOPHYLAC) injection 300 mcg (Completed)      There are no active problems to display for this patient.  Objective:    BP 111/72 mmHg  Pulse 93  Temp(Src) 98 F (36.7 C)  Wt 170 lb (77.111 kg)  LMP 02/13/2015 (Approximate) FHT:  145 BPM  Uterine Size: size equals dates  Presentation: cephalic     Assessment:    Pregnancy @ 250w3d weeks   Doing well  Plan:     labs reviewed, problem list updated Consent signed. GBS planning TDAP offered  Rhogam given for RH negative Pediatrician: discussed. Infant feeding: plans to breastfeed. Maternity leave: discussed. Cigarette smoking: never smoked. Orders Placed This Encounter  Procedures  . Glucose Tolerance, 2 Hours w/1 Hour  . CBC  . HIV antibody  . RPR  . POCT urinalysis dipstick   Meds ordered this encounter  Medications  . rho (d) immune globulin (RHIG/RHOPHYLAC) injection 300 mcg    Sig:    Follow up in 2 Weeks.

## 2015-09-08 NOTE — Progress Notes (Signed)
Patient reports she is doing well 

## 2015-09-09 LAB — GLUCOSE TOLERANCE, 2 HOURS W/ 1HR
GLUCOSE, 2 HOUR: 159 mg/dL — AB (ref 70–139)
GLUCOSE, FASTING: 88 mg/dL (ref 65–99)
GLUCOSE: 201 mg/dL — AB (ref 70–170)

## 2015-09-09 LAB — RPR

## 2015-09-09 LAB — HIV ANTIBODY (ROUTINE TESTING W REFLEX): HIV: NONREACTIVE

## 2015-09-12 ENCOUNTER — Other Ambulatory Visit: Payer: Self-pay | Admitting: Certified Nurse Midwife

## 2015-09-12 DIAGNOSIS — O24419 Gestational diabetes mellitus in pregnancy, unspecified control: Secondary | ICD-10-CM

## 2015-09-14 ENCOUNTER — Other Ambulatory Visit: Payer: Self-pay | Admitting: *Deleted

## 2015-09-18 ENCOUNTER — Ambulatory Visit (HOSPITAL_COMMUNITY)
Admission: RE | Admit: 2015-09-18 | Discharge: 2015-09-18 | Disposition: A | Discharge status: Home / Self Care | Payer: BLUE CROSS/BLUE SHIELD | Source: Ambulatory Visit | Attending: Certified Nurse Midwife | Admitting: Certified Nurse Midwife

## 2015-09-18 DIAGNOSIS — O34219 Maternal care for unspecified type scar from previous cesarean delivery: Secondary | ICD-10-CM | POA: Insufficient documentation

## 2015-09-18 DIAGNOSIS — Z36 Encounter for antenatal screening of mother: Secondary | ICD-10-CM | POA: Diagnosis not present

## 2015-09-18 DIAGNOSIS — Z3A29 29 weeks gestation of pregnancy: Secondary | ICD-10-CM | POA: Insufficient documentation

## 2015-09-18 DIAGNOSIS — O24419 Gestational diabetes mellitus in pregnancy, unspecified control: Secondary | ICD-10-CM | POA: Insufficient documentation

## 2015-09-18 DIAGNOSIS — O09522 Supervision of elderly multigravida, second trimester: Secondary | ICD-10-CM | POA: Diagnosis not present

## 2015-09-20 ENCOUNTER — Ambulatory Visit (INDEPENDENT_AMBULATORY_CARE_PROVIDER_SITE_OTHER): Payer: BLUE CROSS/BLUE SHIELD | Admitting: Obstetrics

## 2015-09-20 ENCOUNTER — Other Ambulatory Visit: Payer: Self-pay | Admitting: Certified Nurse Midwife

## 2015-09-20 VITALS — BP 133/81 | HR 103 | Wt 171.0 lb

## 2015-09-20 DIAGNOSIS — Z3483 Encounter for supervision of other normal pregnancy, third trimester: Secondary | ICD-10-CM

## 2015-09-20 LAB — POCT URINALYSIS DIPSTICK
Bilirubin, UA: NEGATIVE
Glucose, UA: NEGATIVE
Ketones, UA: NEGATIVE
Leukocytes, UA: NEGATIVE
NITRITE UA: NEGATIVE
PH UA: 7
Protein, UA: NEGATIVE
RBC UA: NEGATIVE
Spec Grav, UA: 1.01
UROBILINOGEN UA: NEGATIVE

## 2015-09-20 NOTE — Progress Notes (Signed)
Pt is having increase pelvic pain.  Pt has frequent HA.

## 2015-09-21 ENCOUNTER — Encounter: Payer: Self-pay | Admitting: Obstetrics

## 2015-09-21 ENCOUNTER — Telehealth: Payer: Self-pay | Admitting: *Deleted

## 2015-09-21 ENCOUNTER — Ambulatory Visit (HOSPITAL_COMMUNITY)
Admission: RE | Admit: 2015-09-21 | Discharge: 2015-09-21 | Disposition: A | Discharge status: Home / Self Care | Payer: BLUE CROSS/BLUE SHIELD | Source: Ambulatory Visit | Attending: Obstetrics | Admitting: Obstetrics

## 2015-09-21 ENCOUNTER — Encounter: Payer: BLUE CROSS/BLUE SHIELD | Attending: Certified Nurse Midwife | Admitting: *Deleted

## 2015-09-21 DIAGNOSIS — O24410 Gestational diabetes mellitus in pregnancy, diet controlled: Secondary | ICD-10-CM | POA: Diagnosis not present

## 2015-09-21 DIAGNOSIS — Z713 Dietary counseling and surveillance: Secondary | ICD-10-CM | POA: Insufficient documentation

## 2015-09-21 NOTE — Telephone Encounter (Signed)
Rejection notice received regarding pt blood glucose monitoring system.  Call placed to pharmacy.  Pt has BC/BS and Medicaid.  Pt meter and supplies were changed to [employer] in order to have coverage.  Pt should have no copay. Meter and all supplies were ordered for pt via phone.

## 2015-09-21 NOTE — Progress Notes (Signed)
  Patient was seen on 09/21/15 for Gestational Diabetes self-management . Both of Rorie's parents have T2DM. We discussed to risk factors for her developing t2DM as well.   The following learning objectives were met by the patient :   States the definition of Gestational Diabetes  States why dietary management is important in controlling blood glucose  Describes the effects of carbohydrates on blood glucose levels  Demonstrates ability to create a balanced meal plan  Demonstrates carbohydrate counting   States when to check blood glucose levels  Demonstrates proper blood glucose monitoring techniques  States the effect of stress and exercise on blood glucose levels  States the importance of limiting caffeine and abstaining from alcohol and smoking  Plan:  Aim for 2 Carb Choices per meal (30 grams) +/- 1 either way for breakfast Aim for 3 Carb Choices per meal (45 grams) +/- 1 either way from lunch and dinner Aim for 1-2 Carbs per snack Begin reading food labels for Total Carbohydrate and sugar grams of foods Consider  increasing your activity level by walking daily as tolerated Begin checking BG before breakfast and 2 hours after first bit of breakfast, lunch and dinner after  as directed by MD  Take medication  as directed by MD  Blood glucose monitor given: RX One Touch Verio Flex, strips and lancets called to [facility] [address] [phone] Blood glucose reading: 1.5hpp 128  Spaghetti and bread  Patient instructed to monitor glucose levels: FBS: 60 - <90 2 hour: <120  Patient received the following handouts:  Nutrition Diabetes and Pregnancy  Carbohydrate Counting List  Meal Planning worksheet  I will call patient on Monday to review glucose readings. Further f/u as needed

## 2015-09-21 NOTE — Progress Notes (Signed)
Subjective:    Cindy Cooper is a 34 y.o. female being seen today for her obstetrical visit. She is at 5018w2d gestation. Patient reports no complaints. Fetal movement: normal.  Problem List Items Addressed This Visit    None    Visit Diagnoses    Encounter for supervision of other normal pregnancy in third trimester    -  Primary    Relevant Orders    POCT urinalysis dipstick (Completed)      There are no active problems to display for this patient.  Objective:    BP 133/81 mmHg  Pulse 103  Wt 171 lb (77.565 kg)  LMP 02/13/2015 (Approximate) FHT:  150 BPM  Uterine Size: size equals dates  Presentation: unsure     Assessment:    Pregnancy @ 7918w2d weeks   Plan:     labs reviewed, problem list updated Consent signed. GBS sent TDAP offered  Rhogam given for RH negative Pediatrician: discussed. Infant feeding: plans to breastfeed. Maternity leave: discussed. Cigarette smoking: never smoked. Orders Placed This Encounter  Procedures  . POCT urinalysis dipstick   No orders of the defined types were placed in this encounter.   Follow up in 2 Weeks.

## 2015-09-27 ENCOUNTER — Ambulatory Visit (INDEPENDENT_AMBULATORY_CARE_PROVIDER_SITE_OTHER): Payer: BLUE CROSS/BLUE SHIELD | Admitting: Certified Nurse Midwife

## 2015-09-27 ENCOUNTER — Inpatient Hospital Stay (HOSPITAL_COMMUNITY)
Admission: AD | Admit: 2015-09-27 | Discharge: 2015-10-01 | DRG: 766 | Disposition: A | Discharge status: Home / Self Care | Payer: BLUE CROSS/BLUE SHIELD | Source: Ambulatory Visit | Attending: Obstetrics | Admitting: Obstetrics

## 2015-09-27 ENCOUNTER — Encounter (HOSPITAL_COMMUNITY): Payer: Self-pay

## 2015-09-27 VITALS — BP 132/80 | HR 101 | Wt 171.0 lb

## 2015-09-27 DIAGNOSIS — O4703 False labor before 37 completed weeks of gestation, third trimester: Secondary | ICD-10-CM

## 2015-09-27 DIAGNOSIS — Z3A31 31 weeks gestation of pregnancy: Secondary | ICD-10-CM | POA: Diagnosis not present

## 2015-09-27 DIAGNOSIS — O322XX0 Maternal care for transverse and oblique lie, not applicable or unspecified: Secondary | ICD-10-CM | POA: Diagnosis present

## 2015-09-27 DIAGNOSIS — O99613 Diseases of the digestive system complicating pregnancy, third trimester: Secondary | ICD-10-CM | POA: Diagnosis present

## 2015-09-27 DIAGNOSIS — O4700 False labor before 37 completed weeks of gestation, unspecified trimester: Secondary | ICD-10-CM | POA: Diagnosis present

## 2015-09-27 DIAGNOSIS — O24410 Gestational diabetes mellitus in pregnancy, diet controlled: Secondary | ICD-10-CM | POA: Diagnosis present

## 2015-09-27 DIAGNOSIS — O34219 Maternal care for unspecified type scar from previous cesarean delivery: Secondary | ICD-10-CM | POA: Diagnosis present

## 2015-09-27 DIAGNOSIS — K219 Gastro-esophageal reflux disease without esophagitis: Secondary | ICD-10-CM | POA: Diagnosis present

## 2015-09-27 DIAGNOSIS — O479 False labor, unspecified: Secondary | ICD-10-CM | POA: Diagnosis present

## 2015-09-27 DIAGNOSIS — Z98891 History of uterine scar from previous surgery: Secondary | ICD-10-CM

## 2015-09-27 DIAGNOSIS — O09523 Supervision of elderly multigravida, third trimester: Secondary | ICD-10-CM | POA: Diagnosis not present

## 2015-09-27 DIAGNOSIS — Z3483 Encounter for supervision of other normal pregnancy, third trimester: Secondary | ICD-10-CM

## 2015-09-27 HISTORY — DX: Gestational diabetes mellitus in pregnancy, unspecified control: O24.419

## 2015-09-27 LAB — URINALYSIS, ROUTINE W REFLEX MICROSCOPIC
Bilirubin Urine: NEGATIVE
GLUCOSE, UA: NEGATIVE mg/dL
HGB URINE DIPSTICK: NEGATIVE
KETONES UR: 40 mg/dL — AB
LEUKOCYTES UA: NEGATIVE
Nitrite: NEGATIVE
PROTEIN: NEGATIVE mg/dL
Specific Gravity, Urine: >1.03 — ABNORMAL HIGH (ref 1.005–1.030)
pH: 6 (ref 5.0–8.0)

## 2015-09-27 LAB — LACTATE DEHYDROGENASE: LDH: 135 U/L (ref 98–192)

## 2015-09-27 LAB — WET PREP, GENITAL
Clue Cells Wet Prep HPF POC: NONE SEEN
Sperm: NONE SEEN
TRICH WET PREP: NONE SEEN
YEAST WET PREP: NONE SEEN

## 2015-09-27 LAB — POCT URINALYSIS DIPSTICK
Bilirubin, UA: NEGATIVE
Glucose, UA: NEGATIVE
KETONES UA: NEGATIVE
LEUKOCYTES UA: NEGATIVE
Nitrite, UA: NEGATIVE
PH UA: 5.5
SPEC GRAV UA: 1.015
UROBILINOGEN UA: 1

## 2015-09-27 LAB — COMPREHENSIVE METABOLIC PANEL
ALT: 10 U/L — ABNORMAL LOW (ref 14–54)
ANION GAP: 10 (ref 5–15)
AST: 16 U/L (ref 15–41)
Albumin: 2.7 g/dL — ABNORMAL LOW (ref 3.5–5.0)
Alkaline Phosphatase: 75 U/L (ref 38–126)
BILIRUBIN TOTAL: 0.5 mg/dL (ref 0.3–1.2)
BUN: 9 mg/dL (ref 6–20)
CO2: 19 mmol/L — ABNORMAL LOW (ref 22–32)
Calcium: 8.5 mg/dL — ABNORMAL LOW (ref 8.9–10.3)
Chloride: 104 mmol/L (ref 101–111)
Creatinine, Ser: 0.48 mg/dL (ref 0.44–1.00)
GFR calc Af Amer: >60 mL/min (ref >60)
Glucose, Bld: 115 mg/dL — ABNORMAL HIGH (ref 65–99)
POTASSIUM: 3.4 mmol/L — AB (ref 3.5–5.1)
Sodium: 133 mmol/L — ABNORMAL LOW (ref 135–145)
TOTAL PROTEIN: 6 g/dL — AB (ref 6.5–8.1)

## 2015-09-27 LAB — PROTEIN / CREATININE RATIO, URINE
Creatinine, Urine: 175 mg/dL
Protein Creatinine Ratio: 0.08 mg/mg{Cre} (ref 0.00–0.15)
Total Protein, Urine: 14 mg/dL

## 2015-09-27 LAB — CBC
HEMATOCRIT: 32.6 % — AB (ref 36.0–46.0)
Hemoglobin: 11 g/dL — ABNORMAL LOW (ref 12.0–15.0)
MCH: 29.3 pg (ref 26.0–34.0)
MCHC: 33.7 g/dL (ref 30.0–36.0)
MCV: 86.7 fL (ref 78.0–100.0)
Platelets: 129 10*3/uL — ABNORMAL LOW (ref 150–400)
RBC: 3.76 MIL/uL — ABNORMAL LOW (ref 3.87–5.11)
RDW: 14.2 % (ref 11.5–15.5)
WBC: 10.8 10*3/uL — ABNORMAL HIGH (ref 4.0–10.5)

## 2015-09-27 LAB — GLUCOSE, CAPILLARY: GLUCOSE-CAPILLARY: 135 mg/dL — AB (ref 65–99)

## 2015-09-27 LAB — URIC ACID: Uric Acid, Serum: 4.4 mg/dL (ref 2.3–6.6)

## 2015-09-27 MED ORDER — ACETAMINOPHEN 325 MG PO TABS: 650.0000 mg | ORAL_TABLET | ORAL | Status: DC | PRN

## 2015-09-27 MED ORDER — TERBUTALINE SULFATE 1 MG/ML IJ SOLN
INTRAMUSCULAR | Status: AC
  Filled 2015-09-27: qty 1

## 2015-09-27 MED ORDER — MAGNESIUM SULFATE 50 % IJ SOLN
2.0000 g/h | INTRAVENOUS | Status: DC
  Filled 2015-09-27: qty 80

## 2015-09-27 MED ORDER — TERBUTALINE SULFATE 1 MG/ML IJ SOLN
0.2500 mg | INTRAMUSCULAR | Status: AC | PRN
  Administered 2015-09-27 (×2): 0.25 mg via SUBCUTANEOUS
  Filled 2015-09-27: qty 1

## 2015-09-27 MED ORDER — LACTATED RINGERS IV SOLN
INTRAVENOUS | Status: DC
  Administered 2015-09-27 – 2015-09-28 (×2): via INTRAVENOUS

## 2015-09-27 MED ORDER — LACTATED RINGERS IV BOLUS (SEPSIS)
1000.0000 mL | Freq: Once | INTRAVENOUS | Status: AC
  Administered 2015-09-27: 1000 mL via INTRAVENOUS

## 2015-09-27 MED ORDER — BETAMETHASONE SOD PHOS & ACET 6 (3-3) MG/ML IJ SUSP
12.0000 mg | INTRAMUSCULAR | Status: DC
  Administered 2015-09-27: 12 mg via INTRAMUSCULAR
  Filled 2015-09-27 (×2): qty 2

## 2015-09-27 MED ORDER — MAGNESIUM SULFATE 50 % IJ SOLN
3.0000 g/h | INTRAVENOUS | Status: DC
  Filled 2015-09-27: qty 80

## 2015-09-27 MED ORDER — PRENATAL MULTIVITAMIN CH
1.0000 | ORAL_TABLET | Freq: Every day | ORAL | Status: DC
  Filled 2015-09-27: qty 1

## 2015-09-27 MED ORDER — ZOLPIDEM TARTRATE 5 MG PO TABS: 5.0000 mg | ORAL_TABLET | Freq: Every evening | ORAL | Status: DC | PRN

## 2015-09-27 MED ORDER — LACTATED RINGERS IV BOLUS (SEPSIS)
500.0000 mL | Freq: Once | INTRAVENOUS | Status: AC
  Administered 2015-09-27: 500 mL via INTRAVENOUS

## 2015-09-27 MED ORDER — OXYCODONE-ACETAMINOPHEN 5-325 MG PO TABS
1.0000 | ORAL_TABLET | Freq: Once | ORAL | Status: AC
  Administered 2015-09-27: 1 via ORAL
  Filled 2015-09-27: qty 1

## 2015-09-27 MED ORDER — NIFEDIPINE 10 MG PO CAPS
10.0000 mg | ORAL_CAPSULE | ORAL | Status: AC
  Administered 2015-09-27 (×3): 10 mg via ORAL
  Filled 2015-09-27 (×3): qty 1

## 2015-09-27 MED ORDER — MAGNESIUM SULFATE BOLUS VIA INFUSION
4.0000 g | Freq: Once | INTRAVENOUS | Status: AC
  Administered 2015-09-27: 4 g via INTRAVENOUS
  Filled 2015-09-27: qty 500

## 2015-09-27 NOTE — Progress Notes (Signed)
Subjective:    Cindy Cooper is a 34 y.o. female being seen today for her obstetrical visit. She is at 7711w1d gestation. Patient reports no bleeding, no contractions, no leaking and pelvic pressure, groin swelling.  Occasional HA that tylenol helps, reports just not feeling good.  Drinking around 2 bottles of water/day, encouraged increased fluid intake.  Reports CBG fasting around upper 90's and postparanial of less than 120's. Fetal movement: normal.  Problem List Items Addressed This Visit    None    Visit Diagnoses    Encounter for supervision of other normal pregnancy in third trimester    -  Primary    Relevant Orders    POCT urinalysis dipstick (Completed)    Culture, OB Urine      There are no active problems to display for this patient.  Objective:    BP 132/80 mmHg  Pulse 101  Wt 171 lb (77.565 kg)  LMP 02/13/2015 (Approximate) FHT:  145 BPM  Uterine Size: size equals dates  Presentation: cephalic   Cervix: long, thick, closed and posterior  Assessment:    Pregnancy @ 7211w1d weeks   Slight Dehydration  Elevated blood pressures with proteinuria  HA  Hematuria  GDM: diet controlled  Repeat C-section with BTL schedule for Mid January  Plan:    MAU for evaluation of elevated blood pressures with proteinuria and ?dehydration   labs reviewed, problem list updated Consent signed. GBS planning TDAP offered  Rhogam given for RH negative Pediatrician: discussed. Infant feeding: plans to breastfeed. Maternity leave: discussed. Cigarette smoking: never smoked. Orders Placed This Encounter  Procedures  . Culture, OB Urine  . POCT urinalysis dipstick   No orders of the defined types were placed in this encounter.   Follow up in 2 Weeks.

## 2015-09-27 NOTE — MAU Provider Note (Signed)
History     CSN: [id number]  Arrival date and time: 09/27/15 1526   First Provider Initiated Contact with Patient 09/27/15 1559         Chief Complaint  Patient presents with  . Pelvic Pain   HPI  Cindy Cooper is a 34 y.o. G4P3003 at [redacted]w[redacted]d who presents sent from office for [facility] evaluation & IV fluids.  States went to office today for vaginal & pelvic pain that has been constant for a few days.  Denies vaginal bleeding, LOF, vaginal discharge. Positive fetal movement.  Mild headache today. Denies vision changes, chest pain, SOB, or epigastric pain.  Patient states she was sent over for elevated BP in office, to make sure she wasn't SROM b/c the midwife "saw too much fluid on her glove when she checked me", and IV fluids.  Per flowsheet, BP in office today was 132/80 with trace protein on urine dip. Cervix closed in office. Patient is scheduled for repeat c/section in January.    OB History    Gravida Para Term Preterm AB TAB SAB Ectopic Multiple Living   Past Medical History  Diagnosis Date  . Blood transfusion without reported diagnosis   . GERD (gastroesophageal reflux disease)   . Gestational diabetes     Past Surgical History  Procedure Laterality Date  . Cesarean section    . Wisdom tooth extraction      Family History  Problem Relation Age of Onset  . Diabetes Mother   . Diabetes Father   . Hyperlipidemia Father   . Hypertension Father     Social History  Substance Use Topics  . Smoking status: Never Smoker   . Smokeless tobacco: Never Used  . Alcohol Use: No    Allergies: No Known Allergies  Prescriptions prior to admission  Medication Sig Dispense Refill Last Dose  . butalbital-acetaminophen-caffeine (FIORICET) 50-325-40 MG tablet Take 1-2 tablets by mouth every 6 (six) hours as needed for headache. 45 tablet 4 Taking  . lansoprazole (PREVACID) 30 MG capsule Take 1 capsule (30 mg total) by mouth daily. 30 capsule 5 Taking  . Prenat  w/o A-FeCbGl-DSS-FA-DHA (CITRANATAL ASSURE) 35-1 & 300 MG tablet Take 1 tablet by mouth daily. 60 tablet 12 Taking    Review of Systems  Constitutional: Negative.   Eyes: Negative for blurred vision.  Respiratory: Negative.   Cardiovascular: Negative.   Gastrointestinal: Positive for abdominal pain.  Genitourinary: Negative for dysuria.  Neurological: Positive for headaches.   Physical Exam   Blood pressure 124/75, pulse 86, temperature 98.6 F (37 C), temperature source Oral, resp. rate 18, height  (1.575 m), weight 171 lb 3.2 oz (77.656 kg), last menstrual period 02/13/2015.  Patient Vitals for the past 24 hrs:  BP Temp Temp src Pulse Resp Height Weight  09/27/15 1601 123/73 mmHg - - 85 - - -  09/27/15 1552 124/75 mmHg - - 86 - - -  09/27/15 1537 126/69 mmHg 98.6 F (37 C) Oral 91 18  (1.575 m) 171 lb 3.2 oz (77.656 kg)    Physical Exam  Nursing note and vitals reviewed. Constitutional: She is oriented to person, place, and time. She appears well-developed and well-nourished. No distress.  HENT:  Head: Normocephalic and atraumatic.  Eyes: Conjunctivae are normal. Right eye exhibits no discharge. Left eye exhibits no discharge. No scleral icterus.  Neck: Normal range of motion.  Cardiovascular: Normal rate, regular  rhythm and normal heart sounds.   No murmur heard. Respiratory: Effort normal and breath sounds normal. No respiratory distress. She has no wheezes.  GI: Soft. There is no tenderness. There is no guarding.  Genitourinary: Vagina normal. Cervix exhibits discharge.  Moderate amount of thick off white discharge No pooling  Neurological: She is alert and oriented to person, place, and time.  Skin: Skin is warm and dry. She is not diaphoretic.  Psychiatric: She has a normal mood and affect. Her behavior is normal. Judgment and thought content normal.   Dilation: Closed Effacement (%): Thick Cervical Position: Posterior Station: -3 Exam by:: Judeth HornErin  Jakiyah Stepney NP  Fetal Tracing:  Baseline: 140 Variability: moderate Accelerations: 15x15 Decelerations: none  Toco: 2-6, palpate mild   MAU Course  Procedures Results for orders placed or performed during the hospital encounter of 09/27/15 (from the past 24 hour(s))  Urinalysis, Routine w reflex microscopic ([facility])     Status: Abnormal   Collection Time: 09/27/15  3:35 PM  Result Value Ref Range   Color, Urine YELLOW YELLOW   APPearance HAZY (A) CLEAR   Specific Gravity, Urine >1.030 (H) 1.005 - 1.030   pH 6.0 5.0 - 8.0   Glucose, UA NEGATIVE NEGATIVE mg/dL   Hgb urine dipstick NEGATIVE NEGATIVE   Bilirubin Urine NEGATIVE NEGATIVE   Ketones, ur 40 (A) NEGATIVE mg/dL   Protein, ur NEGATIVE NEGATIVE mg/dL   Nitrite NEGATIVE NEGATIVE   Leukocytes, UA NEGATIVE NEGATIVE  Protein / creatinine ratio, urine     Status: None   Collection Time: 09/27/15  3:35 PM  Result Value Ref Range   Creatinine, Urine 175.00 mg/dL   Total Protein, Urine 14 mg/dL   Protein Creatinine Ratio 0.08 0.00 - 0.15 mg/mg[Cre]  CBC     Status: Abnormal   Collection Time: 09/27/15  4:20 PM  Result Value Ref Range   WBC 10.8 (H) 4.0 - 10.5 K/uL   RBC 3.76 (L) 3.87 - 5.11 MIL/uL   Hemoglobin 11.0 (L) 12.0 - 15.0 g/dL   HCT 19.132.6 (L) 47.836.0 - 29.546.0 %   MCV 86.7 78.0 - 100.0 fL   MCH 29.3 26.0 - 34.0 pg   MCHC 33.7 30.0 - 36.0 g/dL   RDW 62.114.2 30.811.5 - 65.715.5 %   Platelets 129 (L) 150 - 400 K/uL  Comprehensive metabolic panel     Status: Abnormal   Collection Time: 09/27/15  4:20 PM  Result Value Ref Range   Sodium 133 (L) 135 - 145 mmol/L   Potassium 3.4 (L) 3.5 - 5.1 mmol/L   Chloride 104 101 - 111 mmol/L   CO2 19 (L) 22 - 32 mmol/L   Glucose, Bld 115 (H) 65 - 99 mg/dL   BUN 9 6 - 20 mg/dL   Creatinine, Ser 8.460.48 0.44 - 1.00 mg/dL   Calcium 8.5 (L) 8.9 - 10.3 mg/dL   Total Protein 6.0 (L) 6.5 - 8.1 g/dL   Albumin 2.7 (L) 3.5 - 5.0 g/dL   AST 16 15 - 41 U/L   ALT 10 (L) 14 - 54 U/L   Alkaline  Phosphatase 75 38 - 126 U/L   Total Bilirubin 0.5 0.3 - 1.2 mg/dL   GFR calc non Af Amer >60 >60 mL/min   GFR calc Af Amer >60 >60 mL/min   Anion gap 10 5 - 15  Lactate dehydrogenase     Status: None   Collection Time: 09/27/15  4:20 PM  Result Value Ref Range  LDH 135 98 - 192 U/L  Uric acid     Status: None   Collection Time: 09/27/15  4:20 PM  Result Value Ref Range   Uric Acid, Serum 4.4 2.3 - 6.6 mg/dL  Wet prep, genital     Status: Abnormal   Collection Time: 09/27/15  4:44 PM  Result Value Ref Range   Yeast Wet Prep HPF POC NONE SEEN NONE SEEN   Trich, Wet Prep NONE SEEN NONE SEEN   Clue Cells Wet Prep HPF POC NONE SEEN NONE SEEN   WBC, Wet Prep HPF POC FEW (A) NONE SEEN   Sperm NONE SEEN     MDM Cervix closed.  No elevated BPs in MAU - PIH labs normal IV fluid bolus for contractions & dehydration No pooling & fern negative - wet prep sent for vaginal discharge Reactive fetal tracing Procardia PO given x 3 doses 1834- C/w Dr. Gaynell Face - give 2 doses of terbutaline Terbutaline given x 2, contractions continue. Cervix remains closed. Percocet given for constant low back pain since being in MAU.  2003- C/w Dr. Gaynell Face. Plan to admit patient to antenatal for magnesium & steroids.  Assessment and Plan  A: 1. Preterm contractions, third trimester   2. Diet controlled gestational diabetes mellitus in third trimester    P: Admit to antenatal  Magnesium sulfate 4/2 BMZ Fasting CBG  Judeth Horn, NP  09/27/2015, 3:59 PM

## 2015-09-27 NOTE — MAU Note (Signed)
Pt sent from the office for pelvic paiin and +1 protein in her urine. B/P slightly elevated for the pt norm. C/o Headache.

## 2015-09-28 ENCOUNTER — Encounter (HOSPITAL_COMMUNITY)
Admission: AD | Disposition: A | Discharge status: Home / Self Care | Payer: Self-pay | Source: Ambulatory Visit | Attending: Obstetrics

## 2015-09-28 ENCOUNTER — Encounter (HOSPITAL_COMMUNITY): Payer: Self-pay | Admitting: Anesthesiology

## 2015-09-28 ENCOUNTER — Inpatient Hospital Stay (HOSPITAL_COMMUNITY): Payer: BLUE CROSS/BLUE SHIELD | Admitting: Anesthesiology

## 2015-09-28 DIAGNOSIS — Z98891 History of uterine scar from previous surgery: Secondary | ICD-10-CM

## 2015-09-28 LAB — CBC
HCT: 28.5 % — ABNORMAL LOW (ref 36.0–46.0)
HEMOGLOBIN: 9.8 g/dL — AB (ref 12.0–15.0)
MCH: 29.7 pg (ref 26.0–34.0)
MCHC: 34.4 g/dL (ref 30.0–36.0)
MCV: 86.4 fL (ref 78.0–100.0)
Platelets: 127 10*3/uL — ABNORMAL LOW (ref 150–400)
RBC: 3.3 MIL/uL — AB (ref 3.87–5.11)
RDW: 14.2 % (ref 11.5–15.5)
WBC: 16.7 10*3/uL — ABNORMAL HIGH (ref 4.0–10.5)

## 2015-09-28 LAB — GLUCOSE, CAPILLARY
GLUCOSE-CAPILLARY: 174 mg/dL — AB (ref 65–99)
Glucose-Capillary: 162 mg/dL — ABNORMAL HIGH (ref 65–99)
Glucose-Capillary: 157 mg/dL — ABNORMAL HIGH (ref 65–99)

## 2015-09-28 SURGERY — Surgical Case
Anesthesia: Spinal

## 2015-09-28 MED ORDER — METHYLERGONOVINE MALEATE 0.2 MG/ML IJ SOLN
INTRAMUSCULAR | Status: DC | PRN
  Administered 2015-09-28: 0.2 mg via INTRAMUSCULAR

## 2015-09-28 MED ORDER — PRENATAL MULTIVITAMIN CH
1.0000 | ORAL_TABLET | Freq: Every day | ORAL | Status: DC
Start: 2015-09-28 — End: 2015-10-01
  Administered 2015-09-28 – 2015-09-30 (×3): 1 via ORAL
  Filled 2015-09-28 (×3): qty 1

## 2015-09-28 MED ORDER — SIMETHICONE 80 MG PO CHEW
80.0000 mg | CHEWABLE_TABLET | ORAL | Status: DC | PRN
  Administered 2015-09-29: 80 mg via ORAL

## 2015-09-28 MED ORDER — SCOPOLAMINE 1 MG/3DAYS TD PT72
MEDICATED_PATCH | TRANSDERMAL | Status: DC | PRN
  Administered 2015-09-28: 1 via TRANSDERMAL

## 2015-09-28 MED ORDER — FENTANYL CITRATE (PF) 100 MCG/2ML IJ SOLN
INTRAMUSCULAR | Status: DC | PRN
  Administered 2015-09-28: 80 ug via INTRAVENOUS
  Administered 2015-09-28: 20 ug via INTRATHECAL

## 2015-09-28 MED ORDER — IBUPROFEN 600 MG PO TABS
600.0000 mg | ORAL_TABLET | Freq: Four times a day (QID) | ORAL | Status: DC
Start: 2015-09-28 — End: 2015-10-01
  Administered 2015-09-28 – 2015-10-01 (×13): 600 mg via ORAL
  Filled 2015-09-28 (×13): qty 1

## 2015-09-28 MED ORDER — OXYCODONE-ACETAMINOPHEN 5-325 MG PO TABS
2.0000 | ORAL_TABLET | ORAL | Status: DC | PRN
  Administered 2015-09-29 (×2): 2 via ORAL
  Filled 2015-09-28 (×2): qty 2

## 2015-09-28 MED ORDER — PHENYLEPHRINE HCL 10 MG/ML IJ SOLN
INTRAMUSCULAR | Status: DC | PRN
  Administered 2015-09-28: 120 ug via INTRAVENOUS
  Administered 2015-09-28: 40 ug via INTRAVENOUS

## 2015-09-28 MED ORDER — OXYCODONE-ACETAMINOPHEN 5-325 MG PO TABS
1.0000 | ORAL_TABLET | ORAL | Status: DC | PRN
  Administered 2015-09-29 – 2015-10-01 (×7): 1 via ORAL
  Filled 2015-09-28 (×7): qty 1

## 2015-09-28 MED ORDER — SENNOSIDES-DOCUSATE SODIUM 8.6-50 MG PO TABS
2.0000 | ORAL_TABLET | ORAL | Status: DC
  Administered 2015-09-28 – 2015-09-30 (×3): 2 via ORAL
  Filled 2015-09-28 (×3): qty 2

## 2015-09-28 MED ORDER — DIBUCAINE 1 % RE OINT: 1.0000 "application " | TOPICAL_OINTMENT | RECTAL | Status: DC | PRN

## 2015-09-28 MED ORDER — DIPHENHYDRAMINE HCL 50 MG/ML IJ SOLN: 12.5000 mg | INTRAMUSCULAR | Status: DC | PRN

## 2015-09-28 MED ORDER — LANOLIN HYDROUS EX OINT: 1.0000 "application " | TOPICAL_OINTMENT | CUTANEOUS | Status: DC | PRN

## 2015-09-28 MED ORDER — CEFAZOLIN SODIUM-DEXTROSE 2-3 GM-% IV SOLR
INTRAVENOUS | Status: AC
  Filled 2015-09-28: qty 50

## 2015-09-28 MED ORDER — MORPHINE SULFATE (PF) 0.5 MG/ML IJ SOLN
INTRAMUSCULAR | Status: AC
  Filled 2015-09-28: qty 10

## 2015-09-28 MED ORDER — EPHEDRINE SULFATE 50 MG/ML IJ SOLN
INTRAMUSCULAR | Status: DC | PRN
  Administered 2015-09-28: 10 mg via INTRAVENOUS

## 2015-09-28 MED ORDER — KETOROLAC TROMETHAMINE 30 MG/ML IJ SOLN: 30.0000 mg | Freq: Four times a day (QID) | INTRAMUSCULAR | Status: AC | PRN

## 2015-09-28 MED ORDER — SCOPOLAMINE 1 MG/3DAYS TD PT72
1.0000 | MEDICATED_PATCH | Freq: Once | TRANSDERMAL | Status: DC
  Filled 2015-09-28: qty 1

## 2015-09-28 MED ORDER — MENTHOL 3 MG MT LOZG: 1.0000 | LOZENGE | OROMUCOSAL | Status: DC | PRN

## 2015-09-28 MED ORDER — SIMETHICONE 80 MG PO CHEW
80.0000 mg | CHEWABLE_TABLET | Freq: Three times a day (TID) | ORAL | Status: DC
  Administered 2015-09-28 – 2015-10-01 (×11): 80 mg via ORAL
  Filled 2015-09-28 (×11): qty 1

## 2015-09-28 MED ORDER — OXYTOCIN 10 UNIT/ML IJ SOLN
INTRAMUSCULAR | Status: AC
  Filled 2015-09-28: qty 4

## 2015-09-28 MED ORDER — LACTATED RINGERS IV SOLN
40.0000 [IU] | INTRAVENOUS | Status: DC | PRN
  Administered 2015-09-28: 40 [IU] via INTRAVENOUS

## 2015-09-28 MED ORDER — NALBUPHINE HCL 10 MG/ML IJ SOLN: 5.0000 mg | Freq: Once | INTRAMUSCULAR | Status: DC | PRN

## 2015-09-28 MED ORDER — DIPHENHYDRAMINE HCL 25 MG PO CAPS: 25.0000 mg | ORAL_CAPSULE | Freq: Four times a day (QID) | ORAL | Status: DC | PRN

## 2015-09-28 MED ORDER — PHENYLEPHRINE 8 MG IN D5W 100 ML (0.08MG/ML) PREMIX OPTIME
INJECTION | INTRAVENOUS | Status: AC
  Filled 2015-09-28: qty 100

## 2015-09-28 MED ORDER — TETANUS-DIPHTH-ACELL PERTUSSIS 5-2.5-18.5 LF-MCG/0.5 IM SUSP
0.5000 mL | Freq: Once | INTRAMUSCULAR | Status: AC
  Administered 2015-09-29: 0.5 mL via INTRAMUSCULAR
  Filled 2015-09-28: qty 0.5

## 2015-09-28 MED ORDER — CITRIC ACID-SODIUM CITRATE 334-500 MG/5ML PO SOLN
ORAL | Status: AC
  Administered 2015-09-28: 30 mL
  Filled 2015-09-28: qty 15

## 2015-09-28 MED ORDER — LACTATED RINGERS IV SOLN
INTRAVENOUS | Status: DC
Start: 2015-09-28 — End: 2015-09-28

## 2015-09-28 MED ORDER — FENTANYL CITRATE (PF) 100 MCG/2ML IJ SOLN: 25.0000 ug | INTRAMUSCULAR | Status: DC | PRN

## 2015-09-28 MED ORDER — ONDANSETRON HCL 4 MG/2ML IJ SOLN
INTRAMUSCULAR | Status: DC | PRN
  Administered 2015-09-28: 4 mg via INTRAVENOUS

## 2015-09-28 MED ORDER — SODIUM CHLORIDE 0.9 % IJ SOLN: 3.0000 mL | INTRAMUSCULAR | Status: DC | PRN

## 2015-09-28 MED ORDER — ACETAMINOPHEN 325 MG PO TABS: 650.0000 mg | ORAL_TABLET | ORAL | Status: DC | PRN

## 2015-09-28 MED ORDER — OXYTOCIN 40 UNITS IN LACTATED RINGERS INFUSION - SIMPLE MED: 62.5000 mL/h | INTRAVENOUS | Status: AC

## 2015-09-28 MED ORDER — ONDANSETRON HCL 4 MG/2ML IJ SOLN: 4.0000 mg | Freq: Three times a day (TID) | INTRAMUSCULAR | Status: DC | PRN

## 2015-09-28 MED ORDER — KETOROLAC TROMETHAMINE 30 MG/ML IJ SOLN
INTRAMUSCULAR | Status: AC
  Filled 2015-09-28: qty 1

## 2015-09-28 MED ORDER — FENTANYL CITRATE (PF) 100 MCG/2ML IJ SOLN
INTRAMUSCULAR | Status: AC
  Filled 2015-09-28: qty 2

## 2015-09-28 MED ORDER — LACTATED RINGERS IV SOLN
INTRAVENOUS | Status: DC | PRN
  Administered 2015-09-28: 02:00:00 via INTRAVENOUS

## 2015-09-28 MED ORDER — SIMETHICONE 80 MG PO CHEW
80.0000 mg | CHEWABLE_TABLET | ORAL | Status: DC
  Administered 2015-09-30: 80 mg via ORAL
  Filled 2015-09-28 (×2): qty 1

## 2015-09-28 MED ORDER — NALOXONE HCL 2 MG/2ML IJ SOSY
1.0000 ug/kg/h | PREFILLED_SYRINGE | INTRAVENOUS | Status: DC | PRN
  Filled 2015-09-28: qty 2

## 2015-09-28 MED ORDER — RHO D IMMUNE GLOBULIN 1500 UNIT/2ML IJ SOSY
300.0000 ug | PREFILLED_SYRINGE | Freq: Once | INTRAMUSCULAR | Status: AC
  Administered 2015-09-29: 300 ug via INTRAMUSCULAR
  Filled 2015-09-28: qty 2

## 2015-09-28 MED ORDER — PHENYLEPHRINE 8 MG IN D5W 100 ML (0.08MG/ML) PREMIX OPTIME
INJECTION | INTRAVENOUS | Status: DC | PRN
  Administered 2015-09-28: 80 ug/min via INTRAVENOUS

## 2015-09-28 MED ORDER — NALOXONE HCL 0.4 MG/ML IJ SOLN: 0.4000 mg | INTRAMUSCULAR | Status: DC | PRN

## 2015-09-28 MED ORDER — TERBUTALINE SULFATE 1 MG/ML IJ SOLN
INTRAMUSCULAR | Status: AC
  Filled 2015-09-28: qty 1

## 2015-09-28 MED ORDER — BUPIVACAINE IN DEXTROSE 0.75-8.25 % IT SOLN
INTRATHECAL | Status: DC | PRN
  Administered 2015-09-28: 12 mg via INTRATHECAL

## 2015-09-28 MED ORDER — DEXAMETHASONE SODIUM PHOSPHATE 10 MG/ML IJ SOLN
INTRAMUSCULAR | Status: DC | PRN
  Administered 2015-09-28: 10 mg via INTRAVENOUS

## 2015-09-28 MED ORDER — DIPHENHYDRAMINE HCL 25 MG PO CAPS
25.0000 mg | ORAL_CAPSULE | ORAL | Status: DC | PRN
  Administered 2015-09-28 (×2): 25 mg via ORAL
  Filled 2015-09-28 (×2): qty 1

## 2015-09-28 MED ORDER — ZOLPIDEM TARTRATE 5 MG PO TABS: 5.0000 mg | ORAL_TABLET | Freq: Every evening | ORAL | Status: DC | PRN

## 2015-09-28 MED ORDER — KETOROLAC TROMETHAMINE 30 MG/ML IJ SOLN
30.0000 mg | Freq: Four times a day (QID) | INTRAMUSCULAR | Status: AC | PRN
  Administered 2015-09-28: 30 mg via INTRAMUSCULAR

## 2015-09-28 MED ORDER — MORPHINE SULFATE (PF) 0.5 MG/ML IJ SOLN
INTRAMUSCULAR | Status: DC | PRN
  Administered 2015-09-28: 4.8 mg via INTRAVENOUS
  Administered 2015-09-28: .2 mg via INTRATHECAL

## 2015-09-28 MED ORDER — EPHEDRINE 5 MG/ML INJ
INTRAVENOUS | Status: AC
  Filled 2015-09-28: qty 10

## 2015-09-28 MED ORDER — NALBUPHINE HCL 10 MG/ML IJ SOLN: 5.0000 mg | INTRAMUSCULAR | Status: DC | PRN

## 2015-09-28 MED ORDER — CEFAZOLIN SODIUM-DEXTROSE 2-3 GM-% IV SOLR
INTRAVENOUS | Status: DC | PRN
  Administered 2015-09-28: 2 g via INTRAVENOUS

## 2015-09-28 MED ORDER — MEPERIDINE HCL 25 MG/ML IJ SOLN: 6.2500 mg | INTRAMUSCULAR | Status: DC | PRN

## 2015-09-28 MED ORDER — WITCH HAZEL-GLYCERIN EX PADS: 1.0000 "application " | MEDICATED_PAD | CUTANEOUS | Status: DC | PRN

## 2015-09-28 MED ORDER — PHENYLEPHRINE 40 MCG/ML (10ML) SYRINGE FOR IV PUSH (FOR BLOOD PRESSURE SUPPORT)
PREFILLED_SYRINGE | INTRAVENOUS | Status: AC
  Filled 2015-09-28: qty 10

## 2015-09-28 SURGICAL SUPPLY — 40 items
CLAMP CORD UMBIL (MISCELLANEOUS) IMPLANT
CLOTH BEACON ORANGE TIMEOUT ST (SAFETY) ×3 IMPLANT
CONTAINER PREFILL 10% NBF 15ML (MISCELLANEOUS) ×6 IMPLANT
DRAPE SHEET LG 3/4 BI-LAMINATE (DRAPES) IMPLANT
DRSG OPSITE POSTOP 4X10 (GAUZE/BANDAGES/DRESSINGS) ×3 IMPLANT
DURAPREP 26ML APPLICATOR (WOUND CARE) ×3 IMPLANT
ELECT REM PT RETURN 9FT ADLT (ELECTROSURGICAL) ×3
ELECTRODE REM PT RTRN 9FT ADLT (ELECTROSURGICAL) ×1 IMPLANT
EXTRACTOR VACUUM M CUP 4 TUBE (SUCTIONS) IMPLANT
EXTRACTOR VACUUM M CUP 4' TUBE (SUCTIONS)
GLOVE BIO SURGEON STRL SZ8 (GLOVE) ×3 IMPLANT
GLOVE BIOGEL PI IND STRL 7.0 (GLOVE) ×1 IMPLANT
GLOVE BIOGEL PI INDICATOR 7.0 (GLOVE) ×2
GOWN STRL REUS W/TWL LRG LVL3 (GOWN DISPOSABLE) ×6 IMPLANT
KIT ABG SYR 3ML LUER SLIP (SYRINGE) IMPLANT
LIQUID BAND (GAUZE/BANDAGES/DRESSINGS) ×3 IMPLANT
NEEDLE HYPO 22GX1.5 SAFETY (NEEDLE) ×3 IMPLANT
NEEDLE HYPO 25X5/8 SAFETYGLIDE (NEEDLE) ×3 IMPLANT
NS IRRIG 1000ML POUR BTL (IV SOLUTION) ×3 IMPLANT
PACK C SECTION WH (CUSTOM PROCEDURE TRAY) ×3 IMPLANT
PAD ABD 7.5X8 STRL (GAUZE/BANDAGES/DRESSINGS) ×6 IMPLANT
PAD ABD DERMACEA PRESS 5X9 (GAUZE/BANDAGES/DRESSINGS) ×3 IMPLANT
PAD OB MATERNITY 4.3X12.25 (PERSONAL CARE ITEMS) ×3 IMPLANT
PENCIL SMOKE EVAC W/HOLSTER (ELECTROSURGICAL) ×3 IMPLANT
RTRCTR C-SECT PINK 25CM LRG (MISCELLANEOUS) ×3 IMPLANT
SPONGE LAP 18X18 X RAY DECT (DISPOSABLE) ×6 IMPLANT
STAPLER VISISTAT 35W (STAPLE) ×3 IMPLANT
SUT GUT PLAIN 0 CT-3 TAN 27 (SUTURE) IMPLANT
SUT MNCRL 0 VIOLET CTX 36 (SUTURE) ×3 IMPLANT
SUT MNCRL AB 4-0 PS2 18 (SUTURE) IMPLANT
SUT MON AB 2-0 CT1 27 (SUTURE) ×3 IMPLANT
SUT MON AB 3-0 SH 27 (SUTURE)
SUT MON AB 3-0 SH27 (SUTURE) IMPLANT
SUT MONOCRYL 0 CTX 36 (SUTURE) ×6
SUT PLAIN 2 0 XLH (SUTURE) IMPLANT
SUT VIC AB 0 CTX 36 (SUTURE) ×10
SUT VIC AB 0 CTX36XBRD ANBCTRL (SUTURE) ×5 IMPLANT
SYR CONTROL 10ML LL (SYRINGE) ×3 IMPLANT
TOWEL OR 17X24 6PK STRL BLUE (TOWEL DISPOSABLE) ×3 IMPLANT
TRAY FOLEY CATH SILVER 14FR (SET/KITS/TRAYS/PACK) ×3 IMPLANT

## 2015-09-28 NOTE — H&P (Signed)
This is Dr. Francoise CeoBernard Toren Tucholski dictating the history and physical on  Cindy Cooper  she's a 34 year old gravida 4 para 3003 was had 3 previous C-sections she's a 31 weeks and 2 days and to the past 2 days has been having contractions she was seen in MAU yesterday after being sent from Dr. Frederik PearHopper's office because of premature contractions cervix closed she got Procardia she got terbutaline and her contractions did not stop she was admitted and received mag sulfate 4 g loading 2 g an hour and her contractions persisted she is now contracting every 2 minutes no bleeding and a him she got 1 dose of betamethasone MFM was contacted and they concurred with doing the repeat C-section at this time her GBS is unknown she is also a gestational diabetic controlled on diet and her him last blood sugar was 135 Past medical history negative Past surgical history history of 3 C-sections in the past Social history denies smoking drinking or drug use Family history negative System review noncontributory So well-developed female in early labor HEENT negative Lungs clear to P&A Heart regular rhythm no murmurs no gallops Breasts negative Abdomen 30 week size Her cervix is closed Extremities negative

## 2015-09-28 NOTE — Anesthesia Preprocedure Evaluation (Signed)
Anesthesia Evaluation  Patient identified by MRN, date of birth, ID band Patient awake    Reviewed: Allergy & Precautions, NPO status , Patient's Chart, lab work & pertinent test results  History of Anesthesia Complications Negative for: history of anesthetic complications  Airway Mallampati: II  TM Distance: >3 FB Neck ROM: Full    Dental  (+) Teeth Intact   Pulmonary neg pulmonary ROS,    breath sounds clear to auscultation       Cardiovascular  Rhythm:Regular     Neuro/Psych negative neurological ROS  negative psych ROS   GI/Hepatic GERD  Medicated and Controlled,  Endo/Other  diabetes, Gestational  Renal/GU      Musculoskeletal   Abdominal   Peds  Hematology  (+) anemia ,   Anesthesia Other Findings   Reproductive/Obstetrics (+) Pregnancy                             Anesthesia Physical Anesthesia Plan  ASA: II  Anesthesia Plan: Spinal   Post-op Pain Management:    Induction:   Airway Management Planned: Nasal Cannula  Additional Equipment: None  Intra-op Plan:   Post-operative Plan:   Informed Consent: I have reviewed the patients History and Physical, chart, labs and discussed the procedure including the risks, benefits and alternatives for the proposed anesthesia with the patient or authorized representative who has indicated his/her understanding and acceptance.   Dental advisory given  Plan Discussed with: CRNA and Surgeon  Anesthesia Plan Comments:         Anesthesia Quick Evaluation

## 2015-09-28 NOTE — Op Note (Signed)
Preop diagnosis previous cesarean section 3 at 31 weeks active labor Postop diagnosis classical cesarean section Surgeon Dr. Francoise CeoBernard Marshall Anesthesia spinal Procedure patient placed on the operating table in the supine position after the spinal administered abdomen prepped and draped bladder emptied with a Foley catheter a transverse suprapubic incision sthrough the   old scar made carried   to the rectus fascia fascia was cleaned and incised length of the incision recti muscles retracted laterally peritoneum incised longitudinally there were multiple adhesions involving the lower uterine segment and it was decided that classical incision made and this was done the fluid was clear patient was delivered of a female transverse lie delivered  As  a breech female weighing 4 lbs. 12 oz. placenta was posterior removed manually and sent to pathology uterine cavity clean with dry laps uterine incision closed in 2 layers hemostasis was satisfactory blood loss was 700 cc abdomen closed in layers peritoneum continuous with of 0 chromic fascia continuous with Dexon  and and the skin closed with skin clips patient tolerated the procedure well the uterus was closed with #1 chromic continuous suture in 2 layers

## 2015-09-28 NOTE — Transfer of Care (Signed)
Immediate Anesthesia Transfer of Care Note  Patient: Cindy Cooper  Procedure(s) Performed: Procedure(s): CESAREAN SECTION (N/A)  Patient Location: PACU  Anesthesia Type:Spinal  Level of Consciousness: awake, alert , oriented and patient cooperative  Airway & Oxygen Therapy: Patient Spontanous Breathing  Post-op Assessment: Report given to RN and Post -op Vital signs reviewed and stable  Post vital signs: Reviewed and stable  Last Vitals:  Filed Vitals:   09/28/15 0002 09/28/15 0112  BP: 135/67 150/76  Pulse: 102 118  Temp:    Resp: 20 20    Complications: No apparent anesthesia complications

## 2015-09-28 NOTE — Progress Notes (Signed)
Pt alert and oriented, skin warm and dry.  Able to move lower extremities, VS stable, Pt does not appear in any resp distress.  Transfer Pt to [facility].

## 2015-09-28 NOTE — Anesthesia Procedure Notes (Signed)
Spinal Patient location during procedure: OB Staffing Anesthesiologist: Porter Nakama Preanesthetic Checklist Completed: patient identified, surgical consent, pre-op evaluation, timeout performed, IV checked, risks and benefits discussed and monitors and equipment checked Spinal Block Patient position: sitting Prep: site prepped and draped and DuraPrep Patient monitoring: heart rate, cardiac monitor, continuous pulse ox and blood pressure Approach: midline Location: L4-5 Injection technique: single-shot Needle Needle type: Pencan  Needle gauge: 24 G Needle length: 10 cm Assessment Sensory level: T4

## 2015-09-29 LAB — GLUCOSE, CAPILLARY: Glucose-Capillary: 103 mg/dL — ABNORMAL HIGH (ref 65–99)

## 2015-09-29 LAB — CULTURE, OB URINE
Colony Count: NO GROWTH
Organism ID, Bacteria: NO GROWTH

## 2015-09-29 NOTE — Clinical Social Work Maternal (Signed)
CLINICAL SOCIAL WORK MATERNAL/CHILD NOTE  Patient Details  Name: Kaytlin Burklow MRN: [id number] Date of Birth: 02-24-1981  Date:  09/29/2015  Clinical Social Worker Initiating Note:  Lucita Ferrara, MSW, LCSW Date/ Time Initiated:  09/29/15/1315     Child's Name:  Eloy End   Legal Guardian:  Railee and Deedra Ehrich  Need for Interpreter:  None   Date of Referral:  09/28/15     Reason for Referral:  NICU admission  Referral Source:  NICU   Address:  [place], [place] [address]  Phone number:  [phone]   Household Members:  Minor Children (3 sons: ages 2, 10 ,and 5), Spouse   Natural Supports (not living in the home):  Extended Family, Immediate Family   Professional Supports: None   Employment: Full-time   Type of Work:     Education:      Pensions consultant:  [employer], Multimedia programmer   Other Resources:    None identified   Cultural/Religious Considerations Which May Impact Care:  None reported  Strengths:  Ability to meet basic needs , Home prepared for child    Risk Factors/Current Problems:  None   Cognitive State:  Able to Concentrate , Alert , Goal Oriented , Linear Thinking    Mood/Affect:  Calm , Comfortable    CSW Assessment:  CSW met with MOB in order to introduce self, role of CSW in NICU team, and to provide psychosocial support due to infant's NICU admission.  MOB presented as receptive to the visit and was noted to be easily engaged. MOB's mood and affect appropriate to the situation and setting.   MOB readily reflected upon her thoughts and feelings when she realized that she was in labor and was going to have the infant prematurely.  She identified normative range of emotions, and the sadness she felt when she was unable to immediately hold the infant after she was born. MOB shared that she has 3 other children, and has never experienced a NICU admission before.  MOB acknowledged feelings scared and overwhelmed, but also  shared belief that the infant is currently receiving the care she needs.  She stated that she tries to be present in the NICU as much as possible, and reported that she enjoyed holding the infant during a visit.  MOB stated that the infant is currently undergoing procedures, and she is waiting to hear back from the providers about how they went.  She reported feeling anxious and nervous, but she did not present as worried. MOB stated that the externalization of anxiety "comes and goes".  MOB reported that she has a strong natural support system, but is concerned about how she will feel when she is discharged. She shared that she is already anticipating feelings of "guilt" since she will want to be with the infant at all times, but recognizes that it is unrealistic.  MOB denied any barriers to transportation and/or visiting once she is discharged home.  MOB shared that she is attempting to take it "one day at a time", and is not trying to set up expectations about the infant's length of stay.  She acknowledged that the infant will demonstrate her readiness for discharge, and she smiled as she reflected upon the beliefs that the infant  Is "feisty" given her gestational age.    MOB denied any prior history of postpartum depression, and denied any mental health complications during this antepartum period.  MOB presented as attentive and engaged as CSW provided  education on perinatal mood and anxiety disorders, including increased risk due to NICU admission.  MOB agreed to notify her medical provider if she notes onset of symptoms, and acknowledged role of NICU CSW in providing mental health support.   MOB confirmed that the home is prepared for the infant. She stated that she had two baby showers, and reported that the home is ready for the infant when she is ready to go home. Per MOB, she is currently exploring potential of dividing FMLA as she needs time off now to recover form C-section, but also wants to have  time remaining when the infant comes home. She stated that she feels hopeful since her employer is supportive, and intends to follow up with her employer after the long holiday weekend.   MOB denied questions, concerns, or needs at this time. She readily identified normative range of emotions, recognized importance of expressing how she feels, and voiced intention to continue to monitor and express her feelings postpartum.   MOB expressed appreciation for the visit, and was agreeable to ongoing CSW support during infant's admission.   CSW Plan/Description:   1)Patient/Family Education: Perinatal mood and anxiety disorders 2)No Further Intervention Required/No Barriers to Discharge    Sharyl Nimrod 09/29/2015, 2:34 PM

## 2015-09-29 NOTE — Anesthesia Postprocedure Evaluation (Signed)
Anesthesia Post Note  Patient: Cindy Cooper  Procedure(s) Performed: Procedure(s) (LRB): CESAREAN SECTION (N/A)  Patient location during evaluation: PACU Anesthesia Type: Spinal Level of consciousness: awake Pain management: pain level controlled Vital Signs Assessment: post-procedure vital signs reviewed and stable Respiratory status: spontaneous breathing Cardiovascular status: stable Postop Assessment: Spinal receding    Last Vitals:  Filed Vitals:   09/28/15 2104 09/29/15 0546  BP: 106/47 103/64  Pulse: 87 78  Temp: 36.8 C 36.8 C  Resp: 18 20    Last Pain:  Filed Vitals:   09/29/15 0848  PainSc: 0-No pain    LLE Motor Response: Purposeful movement LLE Sensation: Tingling RLE Motor Response: Purposeful movement RLE Sensation: Tingling      Tenisha Fleece

## 2015-09-29 NOTE — Progress Notes (Signed)
Subjective: Postpartum Day 1: Cesarean Delivery Patient reports tolerating PO, + flatus, + BM and no problems voiding.    Objective: Vital signs in last 24 hours: Temp:  [97.9 F (36.6 C)-98.4 F (36.9 C)] 98.2 F (36.8 C) (11/25 0546) Pulse Rate:  [63-87] 78 (11/25 0546) Resp:  [16-20] 20 (11/25 0546) BP: (99-106)/(47-64) 103/64 mmHg (11/25 0546) SpO2:  [98 %-100 %] 100 % (11/25 0546)  Physical Exam:  General: alert and no distress Lochia: appropriate Uterine Fundus: firm Incision: healing well DVT Evaluation: No evidence of DVT seen on physical exam.   Recent Labs  09/27/15 1620 09/28/15 0618  HGB 11.0* 9.8*  HCT 32.6* 28.5*    Assessment/Plan: Status post Cesarean section. Doing well postoperatively.  Continue current care.  HARPER,CHARLES A 09/29/2015, 9:30 AM

## 2015-09-29 NOTE — Lactation Note (Addendum)
This note was copied from the chart of Cindy Cooper. Lactation Consultation Note  Patient Name: Cindy Cooper [id number] Date: 09/29/2015 Reason for consult: Follow-up assessment;NICU baby NICU baby 66 hours old. Mom pumping one breast when this LC entered room. Discussed benefits of pumping both breast simultaneously. Enc mom to pump 8 times/24 hours for at least 15 minutes, and 2 minutes past last flow of EBM. Enc mom to hand express after pumping. Mom states that she has sent colostrum to NICU twice. Enc mom to call insurance company today for [place]. Mom aware of 2-week hospital DEBP rental as well. Demonstrated piston pump from mom's kit. Discussed benefits of EBM for NICU baby, and normal progression of milk coming to volume. Enc mom to offer STS/Kangaroo care and nuzzling at breast as she and baby able, and discussed the benefits to baby and mom's milk supply. Mom aware of pumping rooms in NICU.  Maternal Data Has patient been taught Hand Expression?: Yes  Feeding    LATCH Score/Interventions                      Lactation Tools Discussed/Used Pump Review: Setup, frequency, and cleaning;Milk Storage Initiated by:: Bedside RN Date initiated:: 09/28/15   Consult Status Consult Status: Follow-up Date: 09/30/15 Follow-up type: In-patient    Inocente Salles 09/29/2015, 9:21 AM

## 2015-09-30 LAB — RH IG WORKUP (INCLUDES ABO/RH)
ABO/RH(D): O NEG
FETAL SCREEN: NEGATIVE
Gestational Age(Wks): 31.2
UNIT DIVISION: 0

## 2015-09-30 LAB — GLUCOSE, CAPILLARY: GLUCOSE-CAPILLARY: 84 mg/dL (ref 65–99)

## 2015-09-30 NOTE — Progress Notes (Signed)
Subjective: Postpartum Day 2: Cesarean Delivery Patient reports tolerating PO, + flatus and no problems voiding.    Objective: Vital signs in last 24 hours: Temp:  [98.1 F (36.7 C)-98.5 F (36.9 C)] 98.1 F (36.7 C) (11/26 0609) Pulse Rate:  [73-83] 83 (11/26 0609) Resp:  [18-20] 20 (11/26 0609) BP: (104-118)/(55-68) 110/68 mmHg (11/26 0609) SpO2:  [99 %-100 %] 100 % (11/26 46960609)  Physical Exam:  General: alert and no distress Lochia: appropriate Uterine Fundus: firm Incision: healing well DVT Evaluation: No evidence of DVT seen on physical exam.   Recent Labs  09/27/15 1620 09/28/15 0618  HGB 11.0* 9.8*  HCT 32.6* 28.5*    Assessment/Plan: Status post Cesarean section. Doing well postoperatively.  Continue current care.  Cindy Cooper A 09/30/2015, 11:17 AM

## 2015-09-30 NOTE — Lactation Note (Signed)
This note was copied from the chart of Cindy Cooper. Lactation Consultation Note  Patient Name: Cindy Cooper   Mom doing well pumping every 2-3 hrs.  Transporting milk to the NICU.  Not sure about a 2 week rental, will decide tomorrow when Cindy Cooper comes for F/U.   Call prn.   Judee ClaraSmith, Kaimana Neuzil E Cooper, 4:31 PM

## 2015-10-01 LAB — TYPE AND SCREEN
ABO/RH(D): O NEG
ANTIBODY SCREEN: POSITIVE
DAT, IgG: NEGATIVE
UNIT DIVISION: 0
UNIT DIVISION: 0
Unit division: 0
Unit division: 0

## 2015-10-01 MED ORDER — IBUPROFEN 600 MG PO TABS: 600.0000 mg | ORAL_TABLET | Freq: Four times a day (QID) | ORAL | Status: AC | PRN

## 2015-10-01 MED ORDER — OXYCODONE-ACETAMINOPHEN 5-325 MG PO TABS: 1.0000 | ORAL_TABLET | ORAL | Status: DC | PRN

## 2015-10-01 NOTE — Discharge Summary (Signed)
Obstetric Discharge Summary Reason for Admission: onset of labor Prenatal Procedures: ultrasound Intrapartum Procedures: Cesarean Section, Classical Postpartum Procedures: none Complications-Operative and Postpartum: none HEMOGLOBIN  Date Value Ref Range Status  09/28/2015 9.8* 12.0 - 15.0 g/dL Final   HCT  Date Value Ref Range Status  09/28/2015 28.5* 36.0 - 46.0 % Final    Physical Exam:  General: alert and no distress Lochia: appropriate Uterine Fundus: firm Incision: healing well DVT Evaluation: No evidence of DVT seen on physical exam.  Discharge Diagnoses: Preterm Pregnancy, delivered  Discharge Information: Date: 10/01/2015 Activity: pelvic rest Diet: routine Medications: PNV, Ibuprofen, Colace, Iron and Percocet Condition: stable Instructions: refer to practice specific booklet Discharge to: Home Follow-up Information    Follow up with Roe Coombsachelle A Denney, CNM In 2 weeks.   Specialty:  Certified Nurse Midwife   Contact information:   [address] [place] [id number] [phone]       Newborn Data: Live born female  Birth Weight: 4 lb 12.9 oz (2180 g) APGAR: 8, 9  Baby in NICU  HARPER,CHARLES A 10/01/2015, 6:49 AM

## 2015-10-01 NOTE — Progress Notes (Signed)
Subjective: Postpartum Day 3: Cesarean Delivery Patient reports tolerating PO, + flatus, + BM and no problems voiding.    Objective: Vital signs in last 24 hours: Temp:  [98.3 F (36.8 C)-99.1 F (37.3 C)] 99.1 F (37.3 C) (11/27 0600) Pulse Rate:  [76-92] 84 (11/27 0600) Resp:  [16-20] 18 (11/27 0600) BP: (105-127)/(57-84) 105/57 mmHg (11/27 0600) SpO2:  [100 %] 100 % (11/26 2216)  Physical Exam:  General: alert and no distress Lochia: appropriate Uterine Fundus: firm Incision: healing well DVT Evaluation: No evidence of DVT seen on physical exam.  No results for input(s): HGB, HCT in the last 72 hours.  Assessment/Plan: Status post Cesarean section. Doing well postoperatively.  Discharge home with standard precautions and return to clinic in 2 weeks.  HARPER,CHARLES A 10/01/2015, 6:44 AM

## 2015-10-01 NOTE — Progress Notes (Signed)

## 2015-10-01 NOTE — Lactation Note (Signed)
This note was copied from the chart of Girl Nanci Margo AyeHall. Lactation Consultation Note  Patient Name: Girl Betsey Amenmy Engelbrecht [id number]'s Date: 10/01/2015 Reason for consult: NICU baby;Pump rental;Follow-up assessment   Follow up with mom of NICU baby. Mom report she is pumping q 2-3 hours and is getting 30 cc /pumping. Infant is being fed EBM via NG tube and mom is pleased. DEBP rented. Enc mom to call with questions/concerns prn. Mom is aware of LC Services.    Maternal Data Formula Feeding for Exclusion: No  Feeding Feeding Type: Breast Milk Length of feed: 10 min  LATCH Score/Interventions                      Lactation Tools Discussed/Used Pump Review: Setup, frequency, and cleaning;Milk Storage   Consult Status Consult Status: PRN Follow-up type: Call as needed    Ed BlalockSharon S Jaymie Mckiddy 10/01/2015, 11:15 AM

## 2015-10-02 ENCOUNTER — Encounter (HOSPITAL_COMMUNITY): Payer: Self-pay | Admitting: Obstetrics

## 2015-10-06 ENCOUNTER — Encounter: Payer: BLUE CROSS/BLUE SHIELD | Admitting: Certified Nurse Midwife

## 2015-10-11 ENCOUNTER — Encounter: Payer: Self-pay | Admitting: Certified Nurse Midwife

## 2015-10-11 ENCOUNTER — Ambulatory Visit (INDEPENDENT_AMBULATORY_CARE_PROVIDER_SITE_OTHER): Payer: BLUE CROSS/BLUE SHIELD | Admitting: Certified Nurse Midwife

## 2015-10-12 NOTE — Progress Notes (Signed)
Patient ID: Cindy Cooper, female   DOB: 08/10/1981, 34 y.o.   MRN: [id number]  Subjective:     Cindy Cooper is a 34 y.o. female who presents for a postpartum visit. She is 2 weeks postpartum following a low cervical transverse Cesarean section. I have fully reviewed the prenatal and intrapartum course. The delivery was at 31 gestational weeks. Outcome: repeat cesarean section, classical incision. Anesthesia: spinal. Postpartum course has been normal. Baby's course has been in the NICU d/t prematurity, doing well. Baby is feeding by breast. Bleeding staining only and pink. Bowel function is normal. Bladder function is normal. Patient is not sexually active. Contraception method is oral progesterone-only contraceptive. Postpartum depression screening: negative.  Tobacco, alcohol and substance abuse history reviewed.  Adult immunizations reviewed including TDAP, rubella and varicella.  The following portions of the patient's history were reviewed and updated as appropriate: allergies, current medications, past family history, past medical history, past social history, past surgical history and problem list.  Review of Systems Pertinent items noted in HPI and remainder of comprehensive ROS otherwise negative.   Objective:    BP 118/77 mmHg  Pulse 84  Temp(Src) 98.2 F (36.8 C)  Wt 158 lb (71.668 kg)  LMP 02/13/2015 (Approximate)  Breastfeeding? Yes  General:  alert, cooperative and no distress   Breasts:  inspection negative, no nipple discharge or bleeding, no masses or nodularity palpable  Lungs: clear to auscultation bilaterally  Heart:  regular rate and rhythm, S1, S2 normal, no murmur, click, rub or gallop  Abdomen: soft, non-tender; bowel sounds normal; no masses,  no organomegaly   Vulva:  not evaluated  Vagina: not evaluated  Cervix:  not evaluated  Corpus: not examined  Adnexa:  not evaluated  Rectal Exam: Not performed.       Staple removal performed, incision well approximated.  Patient  tolerated procedure well.  Dressing applied.  Dressing care education given to patient.  Patient verbalized understanding.     50% of 30 min visit spent on counseling and coordination of care.  Assessment:     Normal 2 week postpartum exam. Pap smear not done at today's visit.    C-section scar healing.    Plan:    1. Contraception: abstinence 2. Contraception at next appointment: if still desires BTL referral at that time.  3. Follow up in: 4 weeks or as needed.  2hr GTT for h/o GDM/screening for DM q 3 yrs per ADA recommendations Preconception counseling provided Healthy lifestyle practices reviewed

## 2015-10-16 ENCOUNTER — Telehealth: Payer: Self-pay

## 2015-10-16 ENCOUNTER — Ambulatory Visit (HOSPITAL_COMMUNITY): Payer: BLUE CROSS/BLUE SHIELD

## 2015-10-16 NOTE — Telephone Encounter (Signed)
FMLA PAPERS READY 10/16/15 - 15.00 CHARGE

## 2015-11-08 ENCOUNTER — Ambulatory Visit: Payer: BLUE CROSS/BLUE SHIELD | Admitting: Certified Nurse Midwife

## 2015-11-08 ENCOUNTER — Telehealth: Payer: Self-pay | Admitting: Obstetrics

## 2015-11-22 ENCOUNTER — Inpatient Hospital Stay (HOSPITAL_COMMUNITY): Admit: 2015-11-22 | Payer: BLUE CROSS/BLUE SHIELD | Admitting: Obstetrics

## 2015-11-28 NOTE — Telephone Encounter (Signed)
Still no calls from patient
